# Patient Record
Sex: Female | Born: 1985 | Race: White | Hispanic: No | Marital: Single | Smoking: Former smoker
Health system: Southern US, Community
[De-identification: ages and names within clinical notes are randomized; demographics above are authoritative.]

## PROBLEM LIST (undated history)

## (undated) DIAGNOSIS — F419 Anxiety disorder, unspecified: Secondary | ICD-10-CM

## (undated) DIAGNOSIS — F32A Depression, unspecified: Secondary | ICD-10-CM

## (undated) DIAGNOSIS — F329 Major depressive disorder, single episode, unspecified: Secondary | ICD-10-CM

## (undated) HISTORY — DX: Anxiety disorder, unspecified: F41.9

## (undated) HISTORY — DX: Major depressive disorder, single episode, unspecified: F32.9

## (undated) HISTORY — DX: Depression, unspecified: F32.A

---

## 2009-12-05 ENCOUNTER — Encounter: Payer: Self-pay | Admitting: Internal Medicine

## 2010-04-13 ENCOUNTER — Encounter: Payer: Self-pay | Admitting: Internal Medicine

## 2010-04-13 ENCOUNTER — Ambulatory Visit (INDEPENDENT_AMBULATORY_CARE_PROVIDER_SITE_OTHER): Payer: BC Managed Care – PPO | Admitting: Internal Medicine

## 2010-04-13 DIAGNOSIS — F411 Generalized anxiety disorder: Secondary | ICD-10-CM

## 2010-04-13 DIAGNOSIS — F329 Major depressive disorder, single episode, unspecified: Secondary | ICD-10-CM

## 2010-04-15 DIAGNOSIS — F329 Major depressive disorder, single episode, unspecified: Secondary | ICD-10-CM | POA: Insufficient documentation

## 2010-04-15 DIAGNOSIS — F411 Generalized anxiety disorder: Secondary | ICD-10-CM | POA: Insufficient documentation

## 2010-04-24 NOTE — Assessment & Plan Note (Signed)
Summary: NEW/BCBS/CD   Vital Signs:  Patient profile:   25 year old female Menstrual status:  regular LMP:     04/05/2010 Height:      63 inches Weight:      170 pounds BMI:     30.22 O2 Sat:      97 % on Room air Temp:     98.7 degrees F oral Pulse rate:   72 / minute Pulse rhythm:   regular Resp:     16 per minute BP sitting:   118 / 64  (left arm) Cuff size:   large  Vitals Entered By: Rock Nephew CMA (April 13, 2010 4:12 PM)  Nutrition Counseling: Patient's BMI is greater than 25 and therefore counseled on weight management options.  O2 Flow:  Room air CC: anxiety follow-up LMP (date): 04/05/2010     Menstrual Status regular Enter LMP: 04/05/2010 Last PAP Result normal   Primary Care Provider:  Etta Grandchild MD  CC:  anxiety follow-up.  History of Present Illness: New to me she has a history of anxiety, panic, and depression for several years treated by another doctor. She comes in today saying that her anxiety and panic have been a little worse lately as her cat has been ill and she has an new job. She has an occasional feeling of tingling  and muscle twitches when she in the midst of a panic attack but the the symptoms reslove after a dose of Xanax.  Preventive Screening-Counseling & Management  Alcohol-Tobacco     Alcohol drinks/day: <1     Alcohol type: spirits     >5/day in last 3 mos: no     Alcohol Counseling: not indicated; use of alcohol is not excessive or problematic     Feels need to cut down: no     Feels annoyed by complaints: no     Feels guilty re: drinking: no     Needs 'eye opener' in am: no     Smoking Status: never     Tobacco Counseling: not indicated; no tobacco use  Caffeine-Diet-Exercise     Does Patient Exercise: yes  Hep-HIV-STD-Contraception     Hepatitis Risk: no risk noted     HIV Risk: no risk noted     STD Risk: no risk noted      Sexual History:  not active.        Drug Use:  no.        Blood Transfusions:  no.     Current Medications (verified): 1)  Celexa 10 Mg Tabs (Citalopram Hydrobromide) .... Take 1 Tablet By Mouth Once A Day 2)  Alprazolam 0.5 Mg Tabs (Alprazolam) 3)  Nuvaring 0.12-0.015 Mg/24hr Ring (Etonogestrel-Ethinyl Estradiol)  Allergies (verified): No Known Drug Allergies  Past History:  Past Medical History: Anxiety Depression  Past Surgical History: Denies surgical history  Family History: Family History of Anxiety Family History of Arthritis Family History Depression Family History Hypertension  Social History: Occupation: IT consultant Single Never Smoked Alcohol use-yes Drug use-no Regular exercise-yes Smoking Status:  never Hepatitis Risk:  no risk noted HIV Risk:  no risk noted STD Risk:  no risk noted Sexual History:  not active Blood Transfusions:  no Drug Use:  no Does Patient Exercise:  yes  Review of Systems       The patient complains of depression.  The patient denies anorexia, fever, weight loss, weight gain, chest pain, syncope, dyspnea on exertion, peripheral edema, prolonged cough, headaches, hemoptysis, abdominal  pain, hematuria, genital sores, muscle weakness, suspicious skin lesions, unusual weight change, abnormal bleeding, enlarged lymph nodes, and angioedema.   Psych:  Complains of anxiety, depression, easily tearful, irritability, mental problems, and panic attacks; denies alternate hallucination ( auditory/visual), easily angered, sense of great danger, suicidal thoughts/plans, thoughts of violence, unusual visions or sounds, and thoughts /plans of harming others.  Physical Exam  General:  alert, well-developed, well-nourished, well-hydrated, appropriate dress, normal appearance, healthy-appearing, cooperative to examination, good hygiene, and overweight-appearing.   Head:  normocephalic, atraumatic, no abnormalities observed, and no abnormalities palpated.   Eyes:  vision grossly intact, pupils equal, pupils round, and pupils reactive to  light.   Mouth:  Oral mucosa and oropharynx without lesions or exudates.  Teeth in good repair. Neck:  No deformities, masses, or tenderness noted. Lungs:  Normal respiratory effort, chest expands symmetrically. Lungs are clear to auscultation, no crackles or wheezes. Heart:  Normal rate and regular rhythm. S1 and S2 normal without gallop, murmur, click, rub or other extra sounds. Abdomen:  Bowel sounds positive,abdomen soft and non-tender without masses, organomegaly or hernias noted. Msk:  No deformity or scoliosis noted of thoracic or lumbar spine.   Pulses:  R and L carotid,radial,femoral,dorsalis pedis and posterior tibial pulses are full and equal bilaterally Extremities:  No clubbing, cyanosis, edema, or deformity noted with normal full range of motion of all joints.   Neurologic:  alert & oriented X3, cranial nerves II-XII intact, strength normal in all extremities, sensation intact to light touch, sensation intact to pinprick, gait normal, DTRs symmetrical and normal, finger-to-nose normal, heel-to-shin normal, toes down bilaterally on Babinski, and Romberg negative.   Skin:  Intact without suspicious lesions or rashes Cervical Nodes:  No lymphadenopathy noted Axillary Nodes:  No palpable lymphadenopathy Psych:  Oriented X3, memory intact for recent and remote, normally interactive, good eye contact, not agitated, not suicidal, not homicidal, dysphoric affect, and moderately anxious.     Impression & Recommendations:  Problem # 1:  DEPRESSION (ICD-311) Assessment Deteriorated will increase the dose of citalopram and refer her to Vernell Leep for psychotherapy Her updated medication list for this problem includes:    Alprazolam 0.5 Mg Tabs (Alprazolam) ..... One by mouth three times a day as needed for anxiety    Citalopram Hydrobromide 20 Mg Tabs (Citalopram hydrobromide) ..... One by mouth once daily  Problem # 2:  ANXIETY (ICD-300.00) Assessment: Deteriorated  Her updated  medication list for this problem includes:    Alprazolam 0.5 Mg Tabs (Alprazolam) ..... One by mouth three times a day as needed for anxiety    Citalopram Hydrobromide 20 Mg Tabs (Citalopram hydrobromide) ..... One by mouth once daily  Complete Medication List: 1)  Alprazolam 0.5 Mg Tabs (Alprazolam) .... One by mouth three times a day as needed for anxiety 2)  Nuvaring 0.12-0.015 Mg/24hr Ring (Etonogestrel-ethinyl estradiol) 3)  Citalopram Hydrobromide 20 Mg Tabs (Citalopram hydrobromide) .... One by mouth once daily  Patient Instructions: 1)  Please schedule a follow-up appointment in 2 months. 2)  Start therapy with Vernell Leep [phone]. Prescriptions: ALPRAZOLAM 0.5 MG TABS (ALPRAZOLAM) One by mouth three times a day as needed for anxiety  #90 x 5   Entered and Authorized by:   Etta Grandchild MD   Signed by:   Etta Grandchild MD on 04/13/2010   Method used:   Print then Give to Patient   RxID:   [id number] CITALOPRAM HYDROBROMIDE 20 MG TABS (CITALOPRAM HYDROBROMIDE) One by mouth once daily  #  30 x 11   Entered and Authorized by:   Etta Grandchild MD   Signed by:   Etta Grandchild MD on 04/13/2010   Method used:   Print then Give to Patient   RxID:   [id number]    Orders Added: 1)  New Patient Level IV [34742]    Preventive Care Screening  Pap Smear:    Date:  12/05/2009    Results:  normal   Last Tetanus Booster:    Date:  02/05/2003    Results:  Historical

## 2010-10-15 ENCOUNTER — Ambulatory Visit: Payer: BC Managed Care – PPO | Admitting: Internal Medicine

## 2010-10-17 ENCOUNTER — Ambulatory Visit (INDEPENDENT_AMBULATORY_CARE_PROVIDER_SITE_OTHER): Payer: BC Managed Care – PPO | Admitting: Internal Medicine

## 2010-10-17 ENCOUNTER — Encounter: Payer: Self-pay | Admitting: Internal Medicine

## 2010-10-17 VITALS — BP 112/72 | HR 88 | Temp 98.4°F | Resp 16 | Wt 161.0 lb

## 2010-10-17 DIAGNOSIS — F329 Major depressive disorder, single episode, unspecified: Secondary | ICD-10-CM

## 2010-10-17 DIAGNOSIS — F411 Generalized anxiety disorder: Secondary | ICD-10-CM

## 2010-10-17 MED ORDER — ALPRAZOLAM 0.5 MG PO TABS: 0.5000 mg | ORAL_TABLET | Freq: Three times a day (TID) | ORAL | Status: DC | PRN

## 2010-10-17 NOTE — Assessment & Plan Note (Signed)
Continue xanax prn.   

## 2010-10-17 NOTE — Assessment & Plan Note (Signed)
Continue celexa

## 2010-10-17 NOTE — Progress Notes (Signed)
  Subjective:    Patient ID: Rebecca Lawson, female    DOB: 1985-08-23, 25 y.o.   MRN: [id number]  HPI She returns for f/up and she tells me that she still occasionally has anxiety and panic but overall is doing much better, she requests a refill.    Review of Systems  Constitutional: Negative.   HENT: Negative.   Eyes: Negative.   Respiratory: Negative.   Cardiovascular: Negative.   Gastrointestinal: Negative.   Genitourinary: Negative.   Musculoskeletal: Negative.   Skin: Negative.   Neurological: Negative.   Hematological: Negative.   Psychiatric/Behavioral: Positive for sleep disturbance. Negative for suicidal ideas, hallucinations, behavioral problems, confusion, self-injury, dysphoric mood, decreased concentration and agitation. The patient is nervous/anxious. The patient is not hyperactive.        Objective:   Physical Exam  Vitals reviewed. Constitutional: She is oriented to person, place, and time. She appears well-developed and well-nourished. No distress.  HENT:  Mouth/Throat: Oropharynx is clear and moist. No oropharyngeal exudate.  Eyes: Conjunctivae are normal. Right eye exhibits no discharge. Left eye exhibits no discharge. No scleral icterus.  Neck: Normal range of motion. Neck supple. No JVD present. No tracheal deviation present. No thyromegaly present.  Cardiovascular: Normal rate, regular rhythm, normal heart sounds and intact distal pulses.  Exam reveals no gallop and no friction rub.   No murmur heard. Pulmonary/Chest: Effort normal and breath sounds normal. No stridor. No respiratory distress. She has no wheezes. She has no rales. She exhibits no tenderness.  Abdominal: Soft. Bowel sounds are normal. She exhibits no distension and no mass. There is no tenderness. There is no rebound and no guarding.  Musculoskeletal: Normal range of motion. She exhibits no edema and no tenderness.  Lymphadenopathy:    She has no cervical adenopathy.  Neurological: She is  oriented to person, place, and time. She displays normal reflexes. She exhibits normal muscle tone. Coordination normal.  Skin: Skin is warm and dry. No rash noted. She is not diaphoretic. No erythema. No pallor.  Psychiatric: She has a normal mood and affect. Her behavior is normal. Judgment and thought content normal.          Assessment & Plan:

## 2010-10-17 NOTE — Patient Instructions (Signed)
Anxiety and Panic Attacks Your caregiver has informed you that you are having an anxiety or panic attack. There may be many forms of this. Most of the time these attacks come suddenly and without warning. They come at any time of day, including periods of sleep, and at any time of life. They may be strong and unexplained. Although panic attacks are very scary, they are physically harmless. Sometimes the cause of your anxiety is not known. Anxiety is a protective mechanism of the body in its fight or flight mechanism. Most of these perceived danger situations are actually nonphysical situations (such as anxiety over losing a job). CAUSES The causes of an anxiety or panic attack are many. Panic attacks may occur in otherwise healthy people given a certain set of circumstances. There may be a genetic cause for panic attacks. Some medications may also have anxiety as a side effect. SYMPTOMS Some of the most common feelings are:  Intense terror.  Dizziness, feeling faint.   Hot and cold flashes.   Fear of going crazy.   Feelings that nothing is real.   Sweating.   Shaking.   Chest pain or a fast heartbeat (palpitations).  Smothering, choking sensations.   Feelings of impending doom and that death is near.   Tingling of extremities, this may be from over breathing.   Altered reality (derealization).   Being detached from yourself (depersonalization).   Several symptoms can be present to make up anxiety or panic attacks. DIAGNOSIS The evaluation by your caregiver will depend on the type of symptoms you are experiencing. The diagnosis of anxiety or pain attack is made when no physical illness can be determined to be a cause of the symptoms. TREATMENT Treatment to prevent anxiety and panic attacks may include:  Avoidance of circumstances that cause anxiety.   Reassurance and relaxation.   Regular exercise.   Relaxation therapies, such as yoga.   Psychotherapy with a psychiatrist  or therapist.   Avoidance of caffeine, alcohol and illegal drugs.   Prescribed medication.  SEEK IMMEDIATE MEDICAL CARE IF:  You experience panic attack symptoms that are different than your usual symptoms.   You have any worsening or concerning symptoms.  Document Released: 01/21/2005 Document Re-Released: 07/11/2009 [employer] Patient Information 2011 [employer]. 

## 2011-01-10 ENCOUNTER — Telehealth: Payer: Self-pay

## 2011-01-10 DIAGNOSIS — F329 Major depressive disorder, single episode, unspecified: Secondary | ICD-10-CM

## 2011-01-10 DIAGNOSIS — F411 Generalized anxiety disorder: Secondary | ICD-10-CM

## 2011-01-10 DIAGNOSIS — F3289 Other specified depressive episodes: Secondary | ICD-10-CM

## 2011-01-10 MED ORDER — CITALOPRAM HYDROBROMIDE 40 MG PO TABS: 40.0000 mg | ORAL_TABLET | Freq: Every day | ORAL | Status: DC

## 2011-01-10 NOTE — Telephone Encounter (Signed)
Patient called c/o increased depression due to change of seasons. Patient would like to know if MD will increase dosage of celexa, Current dosage is 20mg  qd.

## 2011-01-10 NOTE — Telephone Encounter (Signed)
Pharmacy  needed.

## 2011-01-10 NOTE — Telephone Encounter (Signed)
Patient called back and gave pharmacy information and new rx called in.

## 2011-01-10 NOTE — Telephone Encounter (Signed)
LMOVM for pt to call back with pharmacy info

## 2011-02-22 ENCOUNTER — Ambulatory Visit: Payer: BC Managed Care – PPO | Admitting: Internal Medicine

## 2011-04-01 ENCOUNTER — Encounter: Payer: Self-pay | Admitting: Internal Medicine

## 2011-04-01 ENCOUNTER — Ambulatory Visit (INDEPENDENT_AMBULATORY_CARE_PROVIDER_SITE_OTHER): Payer: BC Managed Care – PPO | Admitting: Internal Medicine

## 2011-04-01 DIAGNOSIS — F411 Generalized anxiety disorder: Secondary | ICD-10-CM

## 2011-04-01 DIAGNOSIS — F329 Major depressive disorder, single episode, unspecified: Secondary | ICD-10-CM

## 2011-04-01 MED ORDER — CITALOPRAM HYDROBROMIDE 40 MG PO TABS: 40.0000 mg | ORAL_TABLET | Freq: Every day | ORAL | Status: DC

## 2011-04-01 MED ORDER — ALPRAZOLAM 0.5 MG PO TABS: 0.5000 mg | ORAL_TABLET | Freq: Three times a day (TID) | ORAL | Status: DC | PRN

## 2011-04-01 MED ORDER — ETONOGESTREL-ETHINYL ESTRADIOL 0.12-0.015 MG/24HR VA RING: VAGINAL_RING | VAGINAL | Status: DC

## 2011-04-01 NOTE — Assessment & Plan Note (Signed)
Continue celexa at the current dose

## 2011-04-01 NOTE — Progress Notes (Signed)
  Subjective:    Patient ID: Rebecca Lawson, female    DOB: Sep 30, 1985, 26 y.o.   MRN: [id number]  HPI She returns for f/up and tells me that she is doing well and needs a refill on meds.  Review of Systems  Constitutional: Negative.   HENT: Negative.   Eyes: Negative.   Respiratory: Negative.   Cardiovascular: Negative.   Gastrointestinal: Negative.   Genitourinary: Negative.   Musculoskeletal: Negative.   Skin: Negative.   Neurological: Negative.   Hematological: Negative.   Psychiatric/Behavioral: Negative for suicidal ideas, hallucinations, behavioral problems, confusion, sleep disturbance, self-injury, dysphoric mood, decreased concentration and agitation. The patient is nervous/anxious. The patient is not hyperactive.        Objective:   Physical Exam  Vitals reviewed. Constitutional: She is oriented to person, place, and time. She appears well-developed and well-nourished. No distress.  HENT:  Head: Normocephalic and atraumatic.  Mouth/Throat: Oropharynx is clear and moist. No oropharyngeal exudate.  Eyes: Conjunctivae are normal. Right eye exhibits no discharge. Left eye exhibits no discharge. No scleral icterus.  Neck: Normal range of motion. Neck supple. No JVD present. No tracheal deviation present. No thyromegaly present.  Cardiovascular: Normal rate, regular rhythm, normal heart sounds and intact distal pulses.  Exam reveals no gallop and no friction rub.   No murmur heard. Pulmonary/Chest: Effort normal. No stridor. No respiratory distress. She has no wheezes. She has no rales. She exhibits no tenderness.  Abdominal: Soft. Bowel sounds are normal. She exhibits no distension and no mass. There is no tenderness. There is no rebound and no guarding.  Musculoskeletal: Normal range of motion. She exhibits no edema and no tenderness.  Lymphadenopathy:    She has no cervical adenopathy.  Neurological: She is oriented to person, place, and time.  Skin: Skin is warm and dry.  No rash noted. She is not diaphoretic. No erythema. No pallor.  Psychiatric: She has a normal mood and affect. Her behavior is normal. Judgment and thought content normal.          Assessment & Plan:

## 2011-04-01 NOTE — Assessment & Plan Note (Signed)
Continue xanax as needed, she was referred to [facility]ell Leep for psychotherapy

## 2011-04-01 NOTE — Patient Instructions (Signed)

## 2011-05-02 ENCOUNTER — Other Ambulatory Visit: Payer: Self-pay | Admitting: Internal Medicine

## 2012-02-13 LAB — HM PAP SMEAR: HM Pap smear: ABNORMAL

## 2012-02-17 ENCOUNTER — Other Ambulatory Visit (INDEPENDENT_AMBULATORY_CARE_PROVIDER_SITE_OTHER): Payer: BC Managed Care – PPO

## 2012-02-17 ENCOUNTER — Encounter: Payer: Self-pay | Admitting: Internal Medicine

## 2012-02-17 ENCOUNTER — Ambulatory Visit (INDEPENDENT_AMBULATORY_CARE_PROVIDER_SITE_OTHER): Payer: BC Managed Care – PPO | Admitting: Internal Medicine

## 2012-02-17 VITALS — BP 116/82 | HR 97 | Temp 98.8°F | Resp 16 | Ht 63.0 in | Wt 202.8 lb

## 2012-02-17 DIAGNOSIS — Z0000 Encounter for general adult medical examination without abnormal findings: Secondary | ICD-10-CM

## 2012-02-17 DIAGNOSIS — F3289 Other specified depressive episodes: Secondary | ICD-10-CM

## 2012-02-17 DIAGNOSIS — F329 Major depressive disorder, single episode, unspecified: Secondary | ICD-10-CM

## 2012-02-17 LAB — URINALYSIS, ROUTINE W REFLEX MICROSCOPIC
Bilirubin Urine: NEGATIVE
Ketones, ur: NEGATIVE
Leukocytes, UA: NEGATIVE
Specific Gravity, Urine: <=1.005 (ref 1.000–1.030)
Urine Glucose: NEGATIVE
Urobilinogen, UA: 0.2 (ref 0.0–1.0)

## 2012-02-17 LAB — LIPID PANEL
Cholesterol: 220 mg/dL — ABNORMAL HIGH (ref 0–200)
Total CHOL/HDL Ratio: 5
Triglycerides: 176 mg/dL — ABNORMAL HIGH (ref 0.0–149.0)
VLDL: 35.2 mg/dL (ref 0.0–40.0)

## 2012-02-17 LAB — COMPREHENSIVE METABOLIC PANEL
BUN: 10 mg/dL (ref 6–23)
CO2: 29 mEq/L (ref 19–32)
Calcium: 9.5 mg/dL (ref 8.4–10.5)
Chloride: 101 mEq/L (ref 96–112)
Creatinine, Ser: 0.7 mg/dL (ref 0.4–1.2)
GFR: 107.22 mL/min (ref >60.00)

## 2012-02-17 LAB — CBC WITH DIFFERENTIAL/PLATELET
Basophils Absolute: 0 10*3/uL (ref 0.0–0.1)
Basophils Relative: 0.2 % (ref 0.0–3.0)
Hemoglobin: 13.8 g/dL (ref 12.0–15.0)
Lymphocytes Relative: 18.1 % (ref 12.0–46.0)
Monocytes Relative: 5.4 % (ref 3.0–12.0)
Neutro Abs: 8.3 10*3/uL — ABNORMAL HIGH (ref 1.4–7.7)
Neutrophils Relative %: 75.5 % (ref 43.0–77.0)
RBC: 4.71 Mil/uL (ref 3.87–5.11)
RDW: 12.4 % (ref 11.5–14.6)

## 2012-02-17 LAB — TSH: TSH: 0.89 u[IU]/mL (ref 0.35–5.50)

## 2012-02-17 NOTE — Progress Notes (Signed)
  Subjective:    Patient ID: Rebecca Lawson, female    DOB: 05-Jul-1985, 27 y.o.   MRN: [id number]  HPI  She returns for a physical - she feels well and offers no complaints.  Review of Systems  Constitutional: Negative.   HENT: Negative.   Eyes: Negative.   Respiratory: Negative for chest tightness, shortness of breath, wheezing and stridor.   Cardiovascular: Negative for chest pain, palpitations and leg swelling.  Gastrointestinal: Negative for nausea, vomiting, abdominal pain, diarrhea, constipation and blood in stool.  Genitourinary: Negative.   Musculoskeletal: Negative.   Skin: Negative.   Neurological: Negative.   Hematological: Negative for adenopathy. Does not bruise/bleed easily.  Psychiatric/Behavioral: Positive for dysphoric mood. Negative for suicidal ideas, hallucinations, behavioral problems, confusion, sleep disturbance, self-injury, decreased concentration and agitation. The patient is nervous/anxious. The patient is not hyperactive.        Objective:   Physical Exam  Vitals reviewed. Constitutional: She is oriented to person, place, and time. She appears well-developed and well-nourished. No distress.  HENT:  Head: Normocephalic and atraumatic.  Mouth/Throat: Oropharynx is clear and moist. No oropharyngeal exudate.  Eyes: Conjunctivae normal are normal. Right eye exhibits no discharge. Left eye exhibits no discharge. No scleral icterus.  Neck: Normal range of motion. Neck supple. No JVD present. No tracheal deviation present. No thyromegaly present.  Cardiovascular: Normal rate, regular rhythm and intact distal pulses.  Exam reveals no gallop and no friction rub.   No murmur heard. Pulmonary/Chest: Effort normal and breath sounds normal. No stridor. No respiratory distress. She has no wheezes. She has no rales. She exhibits no tenderness.  Abdominal: Soft. Bowel sounds are normal. She exhibits no distension and no mass. There is no tenderness. There is no rebound and  no guarding.  Musculoskeletal: Normal range of motion. She exhibits no edema and no tenderness.  Lymphadenopathy:    She has no cervical adenopathy.  Neurological: She is oriented to person, place, and time.  Skin: Skin is warm and dry. No rash noted. She is not diaphoretic. No erythema. No pallor.  Psychiatric: She has a normal mood and affect. Her behavior is normal. Judgment and thought content normal.     No results found for this basename: WBC, HGB, HCT, PLT, GLUCOSE, CHOL, TRIG, HDL, LDLDIRECT, LDLCALC, ALT, AST, NA, K, CL, CREATININE, BUN, CO2, TSH, PSA, INR, GLUF, HGBA1C, MICROALBUR       Assessment & Plan:

## 2012-02-17 NOTE — Assessment & Plan Note (Signed)
Exam done Vaccines were reviewed and updated Labs ordered Pt ed material was given 

## 2012-02-17 NOTE — Patient Instructions (Signed)
Preventive Care for Adults, Female A healthy lifestyle and preventive care can promote health and wellness. Preventive health guidelines for women include the following key practices.  A routine yearly physical is a good way to check with your caregiver about your health and preventive screening. It is a chance to share any concerns and updates on your health, and to receive a thorough exam.  Visit your dentist for a routine exam and preventive care every 6 months. Brush your teeth twice a day and floss once a day. Good oral hygiene prevents tooth decay and gum disease.  The frequency of eye exams is based on your age, health, family medical history, use of contact lenses, and other factors. Follow your caregiver's recommendations for frequency of eye exams.  Eat a healthy diet. Foods like vegetables, fruits, whole grains, low-fat dairy products, and lean protein foods contain the nutrients you need without too many calories. Decrease your intake of foods high in solid fats, added sugars, and salt. Eat the right amount of calories for you.Get information about a proper diet from your caregiver, if necessary.  Regular physical exercise is one of the most important things you can do for your health. Most adults should get at least 150 minutes of moderate-intensity exercise (any activity that increases your heart rate and causes you to sweat) each week. In addition, most adults need muscle-strengthening exercises on 2 or more days a week.  Maintain a healthy weight. The body mass index (BMI) is a screening tool to identify possible weight problems. It provides an estimate of body fat based on height and weight. Your caregiver can help determine your BMI, and can help you achieve or maintain a healthy weight.For adults 20 years and older:  A BMI below 18.5 is considered underweight.  A BMI of 18.5 to 24.9 is normal.  A BMI of 25 to 29.9 is considered overweight.  A BMI of 30 and above is  considered obese.  Maintain normal blood lipids and cholesterol levels by exercising and minimizing your intake of saturated fat. Eat a balanced diet with plenty of fruit and vegetables. Blood tests for lipids and cholesterol should begin at age 20 and be repeated every 5 years. If your lipid or cholesterol levels are high, you are over 50, or you are at high risk for heart disease, you may need your cholesterol levels checked more frequently.Ongoing high lipid and cholesterol levels should be treated with medicines if diet and exercise are not effective.  If you smoke, find out from your caregiver how to quit. If you do not use tobacco, do not start.  If you are pregnant, do not drink alcohol. If you are breastfeeding, be very cautious about drinking alcohol. If you are not pregnant and choose to drink alcohol, do not exceed 1 drink per day. One drink is considered to be 12 ounces (355 mL) of beer, 5 ounces (148 mL) of wine, or 1.5 ounces (44 mL) of liquor.  Avoid use of street drugs. Do not share needles with anyone. Ask for help if you need support or instructions about stopping the use of drugs.  High blood pressure causes heart disease and increases the risk of stroke. Your blood pressure should be checked at least every 1 to 2 years. Ongoing high blood pressure should be treated with medicines if weight loss and exercise are not effective.  If you are 55 to 27 years old, ask your caregiver if you should take aspirin to prevent strokes.  Diabetes   screening involves taking a blood sample to check your fasting blood sugar level. This should be done once every 3 years, after age 45, if you are within normal weight and without risk factors for diabetes. Testing should be considered at a younger age or be carried out more frequently if you are overweight and have at least 1 risk factor for diabetes.  Breast cancer screening is essential preventive care for women. You should practice "breast  self-awareness." This means understanding the normal appearance and feel of your breasts and may include breast self-examination. Any changes detected, no matter how small, should be reported to a caregiver. Women in their 20s and 30s should have a clinical breast exam (CBE) by a caregiver as part of a regular health exam every 1 to 3 years. After age 40, women should have a CBE every year. Starting at age 40, women should consider having a mammography (breast X-ray test) every year. Women who have a family history of breast cancer should talk to their caregiver about genetic screening. Women at a high risk of breast cancer should talk to their caregivers about having magnetic resonance imaging (MRI) and a mammography every year.  The Pap test is a screening test for cervical cancer. A Pap test can show cell changes on the cervix that might become cervical cancer if left untreated. A Pap test is a procedure in which cells are obtained and examined from the lower end of the uterus (cervix).  Women should have a Pap test starting at age 21.  Between ages 21 and 29, Pap tests should be repeated every 2 years.  Beginning at age 30, you should have a Pap test every 3 years as long as the past 3 Pap tests have been normal.  Some women have medical problems that increase the chance of getting cervical cancer. Talk to your caregiver about these problems. It is especially important to talk to your caregiver if a new problem develops soon after your last Pap test. In these cases, your caregiver may recommend more frequent screening and Pap tests.  The above recommendations are the same for women who have or have not gotten the vaccine for human papillomavirus (HPV).  If you had a hysterectomy for a problem that was not cancer or a condition that could lead to cancer, then you no longer need Pap tests. Even if you no longer need a Pap test, a regular exam is a good idea to make sure no other problems are  starting.  If you are between ages 65 and 70, and you have had normal Pap tests going back 10 years, you no longer need Pap tests. Even if you no longer need a Pap test, a regular exam is a good idea to make sure no other problems are starting.  If you have had past treatment for cervical cancer or a condition that could lead to cancer, you need Pap tests and screening for cancer for at least 20 years after your treatment.  If Pap tests have been discontinued, risk factors (such as a new sexual partner) need to be reassessed to determine if screening should be resumed.  The HPV test is an additional test that may be used for cervical cancer screening. The HPV test looks for the virus that can cause the cell changes on the cervix. The cells collected during the Pap test can be tested for HPV. The HPV test could be used to screen women aged 30 years and older, and should   be used in women of any age who have unclear Pap test results. After the age of 30, women should have HPV testing at the same frequency as a Pap test.  Colorectal cancer can be detected and often prevented. Most routine colorectal cancer screening begins at the age of 50 and continues through age 75. However, your caregiver may recommend screening at an earlier age if you have risk factors for colon cancer. On a yearly basis, your caregiver may provide home test kits to check for hidden blood in the stool. Use of a small camera at the end of a tube, to directly examine the colon (sigmoidoscopy or colonoscopy), can detect the earliest forms of colorectal cancer. Talk to your caregiver about this at age 50, when routine screening begins. Direct examination of the colon should be repeated every 5 to 10 years through age 75, unless early forms of pre-cancerous polyps or small growths are found.  Hepatitis C blood testing is recommended for all people born from 1945 through 1965 and any individual with known risks for hepatitis C.  Practice  safe sex. Use condoms and avoid high-risk sexual practices to reduce the spread of sexually transmitted infections (STIs). STIs include gonorrhea, chlamydia, syphilis, trichomonas, herpes, HPV, and human immunodeficiency virus (HIV). Herpes, HIV, and HPV are viral illnesses that have no cure. They can result in disability, cancer, and death. Sexually active women aged 25 and younger should be checked for chlamydia. Older women with new or multiple partners should also be tested for chlamydia. Testing for other STIs is recommended if you are sexually active and at increased risk.  Osteoporosis is a disease in which the bones lose minerals and strength with aging. This can result in serious bone fractures. The risk of osteoporosis can be identified using a bone density scan. Women ages 65 and over and women at risk for fractures or osteoporosis should discuss screening with their caregivers. Ask your caregiver whether you should take a calcium supplement or vitamin D to reduce the rate of osteoporosis.  Menopause can be associated with physical symptoms and risks. Hormone replacement therapy is available to decrease symptoms and risks. You should talk to your caregiver about whether hormone replacement therapy is right for you.  Use sunscreen with sun protection factor (SPF) of 30 or more. Apply sunscreen liberally and repeatedly throughout the day. You should seek shade when your shadow is shorter than you. Protect yourself by wearing long sleeves, pants, a wide-brimmed hat, and sunglasses year round, whenever you are outdoors.  Once a month, do a whole body skin exam, using a mirror to look at the skin on your back. Notify your caregiver of new moles, moles that have irregular borders, moles that are larger than a pencil eraser, or moles that have changed in shape or color.  Stay current with required immunizations.  Influenza. You need a dose every fall (or winter). The composition of the flu vaccine  changes each year, so being vaccinated once is not enough.  Pneumococcal polysaccharide. You need 1 to 2 doses if you smoke cigarettes or if you have certain chronic medical conditions. You need 1 dose at age 65 (or older) if you have never been vaccinated.  Tetanus, diphtheria, pertussis (Tdap, Td). Get 1 dose of Tdap vaccine if you are younger than age 65, are over 65 and have contact with an infant, are a healthcare worker, are pregnant, or simply want to be protected from whooping cough. After that, you need a Td   booster dose every 10 years. Consult your caregiver if you have not had at least 3 tetanus and diphtheria-containing shots sometime in your life or have a deep or dirty wound.  HPV. You need this vaccine if you are a woman age 26 or younger. The vaccine is given in 3 doses over 6 months.  Measles, mumps, rubella (MMR). You need at least 1 dose of MMR if you were born in 1957 or later. You may also need a second dose.  Meningococcal. If you are age 19 to 21 and a first-year college student living in a residence hall, or have one of several medical conditions, you need to get vaccinated against meningococcal disease. You may also need additional booster doses.  Zoster (shingles). If you are age 60 or older, you should get this vaccine.  Varicella (chickenpox). If you have never had chickenpox or you were vaccinated but received only 1 dose, talk to your caregiver to find out if you need this vaccine.  Hepatitis A. You need this vaccine if you have a specific risk factor for hepatitis A virus infection or you simply wish to be protected from this disease. The vaccine is usually given as 2 doses, 6 to 18 months apart.  Hepatitis B. You need this vaccine if you have a specific risk factor for hepatitis B virus infection or you simply wish to be protected from this disease. The vaccine is given in 3 doses, usually over 6 months. Preventive Services / Frequency Ages 19 to 39  Blood  pressure check.** / Every 1 to 2 years.  Lipid and cholesterol check.** / Every 5 years beginning at age 20.  Clinical breast exam.** / Every 3 years for women in their 20s and 30s.  Pap test.** / Every 2 years from ages 21 through 29. Every 3 years starting at age 30 through age 65 or 70 with a history of 3 consecutive normal Pap tests.  HPV screening.** / Every 3 years from ages 30 through ages 65 to 70 with a history of 3 consecutive normal Pap tests.  Hepatitis C blood test.** / For any individual with known risks for hepatitis C.  Skin self-exam. / Monthly.  Influenza immunization.** / Every year.  Pneumococcal polysaccharide immunization.** / 1 to 2 doses if you smoke cigarettes or if you have certain chronic medical conditions.  Tetanus, diphtheria, pertussis (Tdap, Td) immunization. / A one-time dose of Tdap vaccine. After that, you need a Td booster dose every 10 years.  HPV immunization. / 3 doses over 6 months, if you are 26 and younger.  Measles, mumps, rubella (MMR) immunization. / You need at least 1 dose of MMR if you were born in 1957 or later. You may also need a second dose.  Meningococcal immunization. / 1 dose if you are age 19 to 21 and a first-year college student living in a residence hall, or have one of several medical conditions, you need to get vaccinated against meningococcal disease. You may also need additional booster doses.  Varicella immunization.** / Consult your caregiver.  Hepatitis A immunization.** / Consult your caregiver. 2 doses, 6 to 18 months apart.  Hepatitis B immunization.** / Consult your caregiver. 3 doses usually over 6 months. Ages 40 to 64  Blood pressure check.** / Every 1 to 2 years.  Lipid and cholesterol check.** / Every 5 years beginning at age 20.  Clinical breast exam.** / Every year after age 40.  Mammogram.** / Every year beginning at age 40   and continuing for as long as you are in good health. Consult with your  caregiver.  Pap test.** / Every 3 years starting at age 30 through age 65 or 70 with a history of 3 consecutive normal Pap tests.  HPV screening.** / Every 3 years from ages 30 through ages 65 to 70 with a history of 3 consecutive normal Pap tests.  Fecal occult blood test (FOBT) of stool. / Every year beginning at age 50 and continuing until age 75. You may not need to do this test if you get a colonoscopy every 10 years.  Flexible sigmoidoscopy or colonoscopy.** / Every 5 years for a flexible sigmoidoscopy or every 10 years for a colonoscopy beginning at age 50 and continuing until age 75.  Hepatitis C blood test.** / For all people born from 1945 through 1965 and any individual with known risks for hepatitis C.  Skin self-exam. / Monthly.  Influenza immunization.** / Every year.  Pneumococcal polysaccharide immunization.** / 1 to 2 doses if you smoke cigarettes or if you have certain chronic medical conditions.  Tetanus, diphtheria, pertussis (Tdap, Td) immunization.** / A one-time dose of Tdap vaccine. After that, you need a Td booster dose every 10 years.  Measles, mumps, rubella (MMR) immunization. / You need at least 1 dose of MMR if you were born in 1957 or later. You may also need a second dose.  Varicella immunization.** / Consult your caregiver.  Meningococcal immunization.** / Consult your caregiver.  Hepatitis A immunization.** / Consult your caregiver. 2 doses, 6 to 18 months apart.  Hepatitis B immunization.** / Consult your caregiver. 3 doses, usually over 6 months. Ages 65 and over  Blood pressure check.** / Every 1 to 2 years.  Lipid and cholesterol check.** / Every 5 years beginning at age 20.  Clinical breast exam.** / Every year after age 40.  Mammogram.** / Every year beginning at age 40 and continuing for as long as you are in good health. Consult with your caregiver.  Pap test.** / Every 3 years starting at age 30 through age 65 or 70 with a 3  consecutive normal Pap tests. Testing can be stopped between 65 and 70 with 3 consecutive normal Pap tests and no abnormal Pap or HPV tests in the past 10 years.  HPV screening.** / Every 3 years from ages 30 through ages 65 or 70 with a history of 3 consecutive normal Pap tests. Testing can be stopped between 65 and 70 with 3 consecutive normal Pap tests and no abnormal Pap or HPV tests in the past 10 years.  Fecal occult blood test (FOBT) of stool. / Every year beginning at age 50 and continuing until age 75. You may not need to do this test if you get a colonoscopy every 10 years.  Flexible sigmoidoscopy or colonoscopy.** / Every 5 years for a flexible sigmoidoscopy or every 10 years for a colonoscopy beginning at age 50 and continuing until age 75.  Hepatitis C blood test.** / For all people born from 1945 through 1965 and any individual with known risks for hepatitis C.  Osteoporosis screening.** / A one-time screening for women ages 65 and over and women at risk for fractures or osteoporosis.  Skin self-exam. / Monthly.  Influenza immunization.** / Every year.  Pneumococcal polysaccharide immunization.** / 1 dose at age 65 (or older) if you have never been vaccinated.  Tetanus, diphtheria, pertussis (Tdap, Td) immunization. / A one-time dose of Tdap vaccine if you are over   65 and have contact with an infant, are a healthcare worker, or simply want to be protected from whooping cough. After that, you need a Td booster dose every 10 years.  Varicella immunization.** / Consult your caregiver.  Meningococcal immunization.** / Consult your caregiver.  Hepatitis A immunization.** / Consult your caregiver. 2 doses, 6 to 18 months apart.  Hepatitis B immunization.** / Check with your caregiver. 3 doses, usually over 6 months. ** Family history and personal history of risk and conditions may change your caregiver's recommendations. Document Released: 03/19/2001 Document Revised: 04/15/2011  Document Reviewed: 06/18/2010 [employer] Patient Information 2013 [employer].  

## 2012-04-25 ENCOUNTER — Other Ambulatory Visit: Payer: Self-pay | Admitting: Internal Medicine

## 2012-07-06 ENCOUNTER — Emergency Department (HOSPITAL_COMMUNITY)
Admission: EM | Admit: 2012-07-06 | Discharge: 2012-07-06 | Disposition: A | Discharge status: Home / Self Care | Payer: BC Managed Care – PPO | Attending: Emergency Medicine | Admitting: Emergency Medicine

## 2012-07-06 ENCOUNTER — Encounter (HOSPITAL_COMMUNITY): Payer: Self-pay | Admitting: Emergency Medicine

## 2012-07-06 DIAGNOSIS — G40909 Epilepsy, unspecified, not intractable, without status epilepticus: Secondary | ICD-10-CM | POA: Insufficient documentation

## 2012-07-06 DIAGNOSIS — T50995A Adverse effect of other drugs, medicaments and biological substances, initial encounter: Secondary | ICD-10-CM | POA: Insufficient documentation

## 2012-07-06 DIAGNOSIS — F3289 Other specified depressive episodes: Secondary | ICD-10-CM | POA: Insufficient documentation

## 2012-07-06 DIAGNOSIS — Z79899 Other long term (current) drug therapy: Secondary | ICD-10-CM | POA: Insufficient documentation

## 2012-07-06 DIAGNOSIS — F329 Major depressive disorder, single episode, unspecified: Secondary | ICD-10-CM | POA: Insufficient documentation

## 2012-07-06 DIAGNOSIS — F411 Generalized anxiety disorder: Secondary | ICD-10-CM | POA: Insufficient documentation

## 2012-07-06 NOTE — ED Provider Notes (Signed)
History     CSN: [id number]  Arrival date & time 07/06/12  1458   First MD Initiated Contact with Patient 07/06/12 1541      Chief Complaint  Patient presents with  . Medication Reaction    (Consider location/radiation/quality/duration/timing/severity/associated sxs/prior treatment) HPI Comments: Rebecca Lawson is a 27 y.o. female who was worried that she will have a bad trip after taking "Blotter  Acid" at 11:45 AM. She was with a friend, who took the same medication at the same time, and then had a seizure. She did not ingest anything else. She ate a normal breakfast earlier this morning. She is clear, coherent, and able to give good history. She denies headache, weakness, dizziness, nausea, vomiting, shortness of breath, or chest pain. There are no other modifying factors.  The history is provided by the patient.    Past Medical History  Diagnosis Date  . Anxiety   . Depression     History reviewed. No pertinent past surgical history.  Family History  Problem Relation Age of Onset  . Arthritis Other   . Depression Other   . Hypertension Other   . Anxiety disorder Other     History  Substance Use Topics  . Smoking status: Never Smoker   . Smokeless tobacco: Not on file     Comment: Regular Exercise - Yes  . Alcohol Use: 1.8 oz/week    3 Glasses of wine per week    OB History   Grav Para Term Preterm Abortions TAB SAB Ect Mult Living                  Review of Systems  All other systems reviewed and are negative.    Allergies  Review of patient's allergies indicates no known allergies.  Home Medications   Current Outpatient Rx  Name  Route  Sig  Dispense  Refill  . buPROPion (WELLBUTRIN XL) 150 MG 24 hr tablet   Oral   Take 150 mg by mouth daily.         Marland Kitchen FLUoxetine (PROZAC) 40 MG capsule   Oral   Take 40 mg by mouth daily.           BP 142/88  Pulse 101  Temp(Src) 98.4 F (36.9 C) (Oral)  Resp 20  SpO2 100%  Physical Exam  Nursing  note and vitals reviewed. Constitutional: She is oriented to person, place, and time. She appears well-developed and well-nourished.  HENT:  Head: Normocephalic and atraumatic.  Eyes: Conjunctivae and EOM are normal. Pupils are equal, round, and reactive to light.  Neck: Normal range of motion and phonation normal. Neck supple.  Cardiovascular: Normal rate, regular rhythm and intact distal pulses.   Pulmonary/Chest: Effort normal and breath sounds normal. She exhibits no tenderness.  Abdominal: Soft. She exhibits no distension. There is no tenderness. There is no guarding.  Musculoskeletal: Normal range of motion.  Neurological: She is alert and oriented to person, place, and time. She has normal strength. No cranial nerve deficit. She exhibits normal muscle tone. Coordination normal.  Good memory  Skin: Skin is warm and dry.  Psychiatric: She has a normal mood and affect. Her behavior is normal. Judgment and thought content normal.    ED Course  Procedures (including critical care time)      1. Drug ingestion, initial encounter       MDM  Ingestion of hallucinogenic medication; without complicating factor. Doubt metabolic instability, serious bacterial infection or impending vascular collapse; the  patient is stable for discharge.  Nursing Notes Reviewed/ Care Coordinated, and agree without changes. Applicable Imaging Reviewed.  Interpretation of Laboratory Data incorporated into ED treatment   Plan: Home Medications- none; Home Treatments- none; Recommended follow up- Return prn          Flint Melter, MD 07/07/12 0028

## 2012-07-06 NOTE — ED Notes (Signed)
Pt states that she took a block amount of acid approx 1 today and states that she had seen her friend have a seizure and she is scared that this may happen to her. No shaking no symtpoms, no dizzyness, alert x4,

## 2013-02-15 ENCOUNTER — Ambulatory Visit (INDEPENDENT_AMBULATORY_CARE_PROVIDER_SITE_OTHER): Payer: BC Managed Care – PPO | Admitting: Internal Medicine

## 2013-02-15 ENCOUNTER — Ambulatory Visit: Payer: BC Managed Care – PPO

## 2013-02-15 VITALS — BP 106/84 | HR 83 | Temp 98.0°F | Resp 16 | Ht 64.0 in | Wt 243.4 lb

## 2013-02-15 DIAGNOSIS — S20211A Contusion of right front wall of thorax, initial encounter: Secondary | ICD-10-CM

## 2013-02-15 DIAGNOSIS — R109 Unspecified abdominal pain: Secondary | ICD-10-CM

## 2013-02-15 DIAGNOSIS — S20219A Contusion of unspecified front wall of thorax, initial encounter: Secondary | ICD-10-CM

## 2013-02-15 DIAGNOSIS — S298XXA Other specified injuries of thorax, initial encounter: Secondary | ICD-10-CM

## 2013-02-15 DIAGNOSIS — R10A1 Flank pain, right side: Secondary | ICD-10-CM

## 2013-02-15 MED ORDER — HYDROCODONE-ACETAMINOPHEN 5-325 MG PO TABS: 1.0000 | ORAL_TABLET | Freq: Four times a day (QID) | ORAL | Status: DC | PRN

## 2013-02-15 NOTE — Progress Notes (Signed)
   Subjective:    Patient ID: Rebecca MemoryStephanie Griego, female    DOB: 10/04/1985, 28 y.o.   MRN: [id number]  HPI    Review of Systems     Objective:   Physical Exam        Assessment & Plan:

## 2013-02-15 NOTE — Patient Instructions (Signed)
Rib Contusion A rib contusion (bruise) can occur by a blow to the chest or by a fall against a hard object. Usually these will be much better in a couple weeks. If X-rays were taken today and there are no broken bones (fractures), the diagnosis of bruising is made. However, broken ribs may not show up for several days, or may be discovered later on a routine X-ray when signs of healing show up. If this happens to you, it does not mean that something was missed on the X-ray, but simply that it did not show up on the first X-rays. Earlier diagnosis will not usually change the treatment. HOME CARE INSTRUCTIONS   Avoid strenuous activity. Be careful during activities and avoid bumping the injured ribs. Activities that pull on the injured ribs and cause pain should be avoided, if possible.  For the first day or two, an ice pack used every 20 minutes while awake may be helpful. Put ice in a plastic bag and put a towel between the bag and the skin.  Eat a normal, well-balanced diet. Drink plenty of fluids to avoid constipation.  Take deep breaths several times a day to keep lungs free of infection. Try to cough several times a day. Splint the injured area with a pillow while coughing to ease pain. Coughing can help prevent pneumonia.  Wear a rib belt or binder only if told to do so by your caregiver. If you are wearing a rib belt or binder, you must do the breathing exercises as directed by your caregiver. If not used properly, rib belts or binders restrict breathing which can lead to pneumonia.  Only take over-the-counter or prescription medicines for pain, discomfort, or fever as directed by your caregiver. SEEK MEDICAL CARE IF:   You or your child has an oral temperature above 102 F (38.9 C).  Your baby is older than 3 months with a rectal temperature of 100.5 F (38.1 C) or higher for more than 1 day.  You develop a cough, with thick or bloody sputum. SEEK IMMEDIATE MEDICAL CARE IF:   You  have difficulty breathing.  You feel sick to your stomach (nausea), have vomiting or belly (abdominal) pain.  You have worsening pain, not controlled with medications, or there is a change in the location of the pain.  You develop sweating or radiation of the pain into the arms, jaw or shoulders, or become light headed or faint.  You or your child has an oral temperature above 102 F (38.9 C), not controlled by medicine.  Your or your baby is older than 3 months with a rectal temperature of 102 F (38.9 C) or higher.  Your baby is 3 months old or younger with a rectal temperature of 100.4 F (38 C) or higher. MAKE SURE YOU:   Understand these instructions.  Will watch your condition.  Will get help right away if you are not doing well or get worse. Document Released: 10/16/2000 Document Revised: 05/18/2012 Document Reviewed: 09/09/2007 [employer] Patient Information 2014 [employer].  

## 2013-02-15 NOTE — Progress Notes (Signed)
   Subjective:    Patient ID: Rebecca Lawson, female    DOB: 09/02/1985, 28 y.o.   MRN: [id number]  HPI  28 y.o. Female presents to clinic with lower right rib pain. States that she fell down her steps at her apartment complex this morning. Denies any trouble breath , but has some pain when deep breaths. Is also having some neck and back pain. Has not taken anything for pain . States that stairs outside of apartment have some kind of mold on them that she slipped on. She was not pushed down and her home is a safe place.  Review of Systems     Objective:   Physical Exam  Vitals reviewed. Constitutional: She appears well-developed and well-nourished. No distress.  HENT:  Head: Normocephalic.  Neck: Normal range of motion. Neck supple.  Cardiovascular: Normal rate, regular rhythm and normal heart sounds.   Pulmonary/Chest: Effort normal and breath sounds normal. She exhibits tenderness.  Musculoskeletal: Normal range of motion. She exhibits tenderness.       Right shoulder: She exhibits tenderness, bony tenderness, pain, spasm and decreased strength. She exhibits normal range of motion, no swelling and no deformity.       Thoracic back: She exhibits tenderness, bony tenderness, pain and spasm. She exhibits normal range of motion, no swelling, no edema, no deformity, no laceration and normal pulse.       Arms: Tender over these ribs   Moves around easily/No distress seen, neck and back have full rom with mild pain.  [facility] reading (PRIMARY) by  Dr.Cristian Davitt no fx seen       Assessment & Plan:  Contusion ribs RICE/Vicodin prn

## 2013-02-18 LAB — HM PAP SMEAR: HM PAP: NORMAL

## 2013-11-06 ENCOUNTER — Telehealth: Payer: Self-pay | Admitting: Family Medicine

## 2013-11-06 ENCOUNTER — Ambulatory Visit (INDEPENDENT_AMBULATORY_CARE_PROVIDER_SITE_OTHER): Payer: BC Managed Care – PPO | Admitting: Family Medicine

## 2013-11-06 ENCOUNTER — Ambulatory Visit (INDEPENDENT_AMBULATORY_CARE_PROVIDER_SITE_OTHER): Payer: BC Managed Care – PPO

## 2013-11-06 VITALS — BP 138/84 | HR 92 | Temp 98.3°F | Resp 18 | Ht 64.0 in

## 2013-11-06 DIAGNOSIS — M25512 Pain in left shoulder: Secondary | ICD-10-CM

## 2013-11-06 DIAGNOSIS — M25511 Pain in right shoulder: Secondary | ICD-10-CM

## 2013-11-06 DIAGNOSIS — M533 Sacrococcygeal disorders, not elsewhere classified: Secondary | ICD-10-CM

## 2013-11-06 MED ORDER — HYDROCODONE-ACETAMINOPHEN 5-325 MG PO TABS: 1.0000 | ORAL_TABLET | Freq: Four times a day (QID) | ORAL | Status: DC | PRN

## 2013-11-06 NOTE — Progress Notes (Signed)
Chief Complaint:  Chief Complaint  Patient presents with  . Tailbone Pain    slipped and fell outside of her apt on Wednesday    HPI: Rebecca Lawson is a 28 y.o. female who is here for pain in her tailbone starting Wednesday, 4 days ago, after she slipped and fell on her bottom in the sacral area due to algae growing on  Apartment stairs from all the rain.  She grabbed the banister and tried to catch herself from falling and  In the process dropped her cell phone but still fell on her bottom. No LOC, no head trauma. She has some pain in her both of her shoulders and upper arms where she braced herself on the railings but no longer has pain and is able to move arms and shoulders. She has tried ibuporfen without relief. She has not iced it. This is the 3rd time she has fallen due to the poor maintenance of the stairs,second time she fell and had rib contusions and came here for that. It  took 8 weeks to heal. She has 6/10 throbbing pain . Denies numbness, tingling or weakness.   She has discomfort with sitting on her bottom and with certain movements. Denies incontinence.   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    Past Medical History  Diagnosis Date  . Anxiety   . Depression    History reviewed. No pertinent past surgical history. History   Social History  . Marital Status: Single    Spouse Name: N/A    Number of Children: N/A  . Years of Education: N/A   Social History Main Topics  . Smoking status: Never Smoker   . Smokeless tobacco: None     Comment: Regular Exercise - Yes  . Alcohol Use: 1.8 oz/week    3 Glasses of wine per week  . Drug Use: No  . Sexual Activity: Not Currently    Birth Control/ Protection: IUD, Condom   Other Topics Concern  . None   Social History Narrative  . None   Family History  Problem Relation Age of Onset  . Arthritis Other   . Depression Other   . Hypertension Other   . Anxiety disorder Other   . Heart disease Father    . Cancer Maternal Grandfather   . Birth defects Paternal Grandfather   . Heart disease Paternal Grandfather    No Known Allergies Prior to Admission medications   Medication Sig Start Date End Date Taking? Authorizing Provider  buPROPion (WELLBUTRIN XL) 150 MG 24 hr tablet Take 150 mg by mouth daily.    Historical Provider, MD  citalopram (CELEXA) 40 MG tablet Take 40 mg by mouth daily.    Historical Provider, MD  FLUoxetine (PROZAC) 40 MG capsule Take 40 mg by mouth daily.    Historical Provider, MD  HYDROcodone-acetaminophen (NORCO/VICODIN) 5-325 MG per tablet Take 1 tablet by mouth every 6 (six) hours as needed. 02/15/13   Jonita Albeehris W Guest, MD     ROS: The patient denies fevers, chills, night sweats, unintentional weight loss, chest pain, palpitations, wheezing, dyspnea on exertion, nausea, vomiting, abdominal pain, dysuria, hematuria, melena, numbness, weakness, or tingling.   All other systems have been reviewed and were otherwise negative with the exception of those mentioned in the HPI and as above.    PHYSICAL EXAM: Filed Vitals:   11/06/13 1503  BP: 138/84  Pulse: 92  Temp: 98.3 F (36.8 C)  Resp:  18   Filed Vitals:   11/06/13 1503  Height: 5\' 4"  (1.626 m)   Body mass index is 0.00 kg/(m^2).  General: Alert, no acute distress, obese white female HEENT:  Normocephalic, atraumatic, oropharynx patent. EOMI, PERRLA Cardiovascular:  Regular rate and rhythm, no rubs murmurs or gallops.  No Carotid bruits, radial pulse intact. No pedal edema.  Respiratory: Clear to auscultation bilaterally.  No wheezes, rales, or rhonchi.  No cyanosis, no use of accessory musculature GI: No organomegaly, abdomen is soft and non-tender, positive bowel sounds.  No masses. Skin: No rashes. Neurologic: Facial musculature symmetric. Psychiatric: Patient is appropriate throughout our interaction. Lymphatic: No cervical lymphadenopathy Musculoskeletal: Gait intact. Neck exam normal-full  ROM Bilateral shoulder exam- no deformities, no ecchymosisneg hawkins/neers, neg empty can, neg liftoff Normal L spine + paramsk tenderness at sacral area  Full ROM 5/5 strength, 2/2 DTRs No saddle anesthesia Straight leg negative Hip and knee exam--normal    LABS:    EKG/XRAY:   Primary read interpreted by [facility] Conley Lawson at [facility]. Difficult to tell due to body habitus but possible displaced coccyx fracture   ASSESSMENT/PLAN: Encounter Diagnoses  Name Primary?  . Sacral back pain Yes  . Shoulder pain, bilateral    [name] who fell down the stairs of her apt due to what she states was algae growing n the stairs and the lack of maintenance of the stairs. She has complained to her landord many time and this is the 3rd time she has fallen because of the lack of maintenance of the stairs even after multiple correspondences with the landlord about fixing them. She is thinking about litigation.  Shoulder exam was WNL so I do not think she  Needs xrays, she will return if xrays are needed Rx norco for pain with precautions Will call patient back with official xray results I am suspecting it may be a fracture or just a contusion of the coccyx   Gross sideeffects, risk and benefits, and alternatives of medications d/w patient. Patient is aware that all medications have potential sideeffects and we are unable to predict every sideeffect or drug-drug interaction that may occur.  LE, THAO PHUONG, DO 11/06/2013 4:19 PM

## 2013-11-06 NOTE — Patient Instructions (Signed)
Tailbone Injury  The tailbone (coccyx) is the small bone at the lower end of the spine. A tailbone injury may involve stretched ligaments, bruising, or a broken bone (fracture). Women are more vulnerable to this injury due to having a wider pelvis.  CAUSES   This type of injury typically occurs from falling and landing on the tailbone. Repeated strain or friction from actions such as rowing and bicycling may also injure the area. The tailbone can be injured during childbirth. Infections or tumors may also press on the tailbone and cause pain. Sometimes, the cause of injury is unknown.  SYMPTOMS    Bruising.   Pain when sitting.   Painful bowel movements.   In women, pain during intercourse.  DIAGNOSIS   Your caregiver can diagnose a tailbone injury based on your symptoms and a physical exam. X-rays may be taken if a fracture is suspected. Your caregiver may also use an MRI scan imaging test to evaluate your symptoms.  TREATMENT   Your caregiver may prescribe medicines to help relieve your pain. Most tailbone injuries heal on their own in 4 to 6 weeks. However, if the injury is caused by an infection or tumor, the recovery period may vary.  PREVENTION   Wear appropriate padding and sports gear when bicycling and rowing. This can help prevent an injury from repeated strain or friction.  HOME CARE INSTRUCTIONS    Put ice on the injured area.   Put ice in a plastic bag.   Place a towel between your skin and the bag.   Leave the ice on for 15-20 minutes, every hour while awake for the first 1 to 2 days.   Sit on a large, rubber or inflated ring or cushion to ease your pain. Lean forward when sitting to help decrease discomfort.   Avoid sitting for long periods of time.   Increase your activity as the pain allows.   Only take over-the-counter or prescription medicines for pain, discomfort, or fever as directed by your caregiver.   You may use stool softeners if it is painful to have a bowel movement, or as  directed by your caregiver.   Eat a diet with plenty of fiber to help prevent constipation.   Keep all follow-up appointments as directed by your caregiver.  SEEK MEDICAL CARE IF:    Your pain becomes worse.   Your bowel movements cause a great deal of discomfort.   You are unable to have a bowel movement.   You have a fever.  MAKE SURE YOU:   Understand these instructions.   Will watch your condition.   Will get help right away if you are not doing well or get worse.  Document Released: 01/19/2000 Document Revised: 04/15/2011 Document Reviewed: 08/16/2010  [employer] Patient Information 2015 [employer]. This information is not intended to replace advice given to you by your health care provider. Make sure you discuss any questions you have with your health care provider.

## 2013-11-09 NOTE — Telephone Encounter (Signed)
error 

## 2014-02-09 ENCOUNTER — Ambulatory Visit (INDEPENDENT_AMBULATORY_CARE_PROVIDER_SITE_OTHER): Payer: BLUE CROSS/BLUE SHIELD | Admitting: *Deleted

## 2014-02-09 ENCOUNTER — Other Ambulatory Visit (INDEPENDENT_AMBULATORY_CARE_PROVIDER_SITE_OTHER): Payer: BLUE CROSS/BLUE SHIELD

## 2014-02-09 ENCOUNTER — Encounter: Payer: Self-pay | Admitting: Internal Medicine

## 2014-02-09 ENCOUNTER — Ambulatory Visit (INDEPENDENT_AMBULATORY_CARE_PROVIDER_SITE_OTHER): Payer: BLUE CROSS/BLUE SHIELD | Admitting: Internal Medicine

## 2014-02-09 VITALS — BP 120/84 | HR 76 | Temp 98.3°F | Resp 16 | Ht 64.0 in | Wt 254.0 lb

## 2014-02-09 DIAGNOSIS — Z23 Encounter for immunization: Secondary | ICD-10-CM

## 2014-02-09 DIAGNOSIS — R002 Palpitations: Secondary | ICD-10-CM

## 2014-02-09 DIAGNOSIS — Z0000 Encounter for general adult medical examination without abnormal findings: Secondary | ICD-10-CM

## 2014-02-09 LAB — LIPID PANEL
CHOLESTEROL: 231 mg/dL — AB (ref 0–200)
HDL: 35 mg/dL — ABNORMAL LOW (ref >39.00)
LDL Cholesterol: 168 mg/dL — ABNORMAL HIGH (ref 0–99)
NonHDL: 196
Total CHOL/HDL Ratio: 7
Triglycerides: 141 mg/dL (ref 0.0–149.0)
VLDL: 28.2 mg/dL (ref 0.0–40.0)

## 2014-02-09 LAB — CBC WITH DIFFERENTIAL/PLATELET
BASOS PCT: 0.4 % (ref 0.0–3.0)
Basophils Absolute: 0 10*3/uL (ref 0.0–0.1)
EOS ABS: 0.1 10*3/uL (ref 0.0–0.7)
Eosinophils Relative: 0.7 % (ref 0.0–5.0)
HEMATOCRIT: 43.9 % (ref 36.0–46.0)
Hemoglobin: 14.6 g/dL (ref 12.0–15.0)
LYMPHS PCT: 20.1 % (ref 12.0–46.0)
Lymphs Abs: 2.5 10*3/uL (ref 0.7–4.0)
MCHC: 33.3 g/dL (ref 30.0–36.0)
MCV: 82.4 fl (ref 78.0–100.0)
Monocytes Absolute: 0.6 10*3/uL (ref 0.1–1.0)
Monocytes Relative: 5 % (ref 3.0–12.0)
Neutro Abs: 9.1 10*3/uL — ABNORMAL HIGH (ref 1.4–7.7)
Neutrophils Relative %: 73.8 % (ref 43.0–77.0)
Platelets: 475 10*3/uL — ABNORMAL HIGH (ref 150.0–400.0)
RBC: 5.34 Mil/uL — AB (ref 3.87–5.11)
RDW: 13.4 % (ref 11.5–15.5)
WBC: 12.4 10*3/uL — ABNORMAL HIGH (ref 4.0–10.5)

## 2014-02-09 LAB — COMPREHENSIVE METABOLIC PANEL
ALK PHOS: 81 U/L (ref 39–117)
ALT: 30 U/L (ref 0–35)
AST: 20 U/L (ref 0–37)
Albumin: 4.4 g/dL (ref 3.5–5.2)
BUN: 13 mg/dL (ref 6–23)
CO2: 25 mEq/L (ref 19–32)
Calcium: 9.2 mg/dL (ref 8.4–10.5)
Chloride: 103 mEq/L (ref 96–112)
Creatinine, Ser: 0.8 mg/dL (ref 0.4–1.2)
GFR: 93.25 mL/min (ref >60.00)
Glucose, Bld: 90 mg/dL (ref 70–99)
POTASSIUM: 3.6 meq/L (ref 3.5–5.1)
Sodium: 136 mEq/L (ref 135–145)
Total Bilirubin: 0.8 mg/dL (ref 0.2–1.2)
Total Protein: 7.6 g/dL (ref 6.0–8.3)

## 2014-02-09 LAB — TSH: TSH: 1.48 u[IU]/mL (ref 0.35–4.50)

## 2014-02-09 LAB — HEMOGLOBIN A1C: Hgb A1c MFr Bld: 5.7 % (ref 4.6–6.5)

## 2014-02-09 MED ORDER — TETANUS-DIPHTH-ACELL PERTUSSIS 5-2.5-18.5 LF-MCG/0.5 IM SUSP
0.5000 mL | Freq: Once | INTRAMUSCULAR | Status: AC
  Administered 2014-02-09: 0.5 mL via INTRAMUSCULAR

## 2014-02-09 NOTE — Patient Instructions (Signed)
Preventive Care for Adults A healthy lifestyle and preventive care can promote health and wellness. Preventive health guidelines for women include the following key practices.  A routine yearly physical is a good way to check with your health care provider about your health and preventive screening. It is a chance to share any concerns and updates on your health and to receive a thorough exam.  Visit your dentist for a routine exam and preventive care every 6 months. Brush your teeth twice a day and floss once a day. Good oral hygiene prevents tooth decay and gum disease.  The frequency of eye exams is based on your age, health, family medical history, use of contact lenses, and other factors. Follow your health care provider's recommendations for frequency of eye exams.  Eat a healthy diet. Foods like vegetables, fruits, whole grains, low-fat dairy products, and lean protein foods contain the nutrients you need without too many calories. Decrease your intake of foods high in solid fats, added sugars, and salt. Eat the right amount of calories for you.Get information about a proper diet from your health care provider, if necessary.  Regular physical exercise is one of the most important things you can do for your health. Most adults should get at least 150 minutes of moderate-intensity exercise (any activity that increases your heart rate and causes you to sweat) each week. In addition, most adults need muscle-strengthening exercises on 2 or more days a week.  Maintain a healthy weight. The body mass index (BMI) is a screening tool to identify possible weight problems. It provides an estimate of body fat based on height and weight. Your health care provider can find your BMI and can help you achieve or maintain a healthy weight.For adults 20 years and older:  A BMI below 18.5 is considered underweight.  A BMI of 18.5 to 24.9 is normal.  A BMI of 25 to 29.9 is considered overweight.  A BMI of  30 and above is considered obese.  Maintain normal blood lipids and cholesterol levels by exercising and minimizing your intake of saturated fat. Eat a balanced diet with plenty of fruit and vegetables. Blood tests for lipids and cholesterol should begin at age 76 and be repeated every 5 years. If your lipid or cholesterol levels are high, you are over 50, or you are at high risk for heart disease, you may need your cholesterol levels checked more frequently.Ongoing high lipid and cholesterol levels should be treated with medicines if diet and exercise are not working.  If you smoke, find out from your health care provider how to quit. If you do not use tobacco, do not start.  Lung cancer screening is recommended for adults aged 22-80 years who are at high risk for developing lung cancer because of a history of smoking. A yearly low-dose CT scan of the lungs is recommended for people who have at least a 30-pack-year history of smoking and are a current smoker or have quit within the past 15 years. A pack year of smoking is smoking an average of 1 pack of cigarettes a day for 1 year (for example: 1 pack a day for 30 years or 2 packs a day for 15 years). Yearly screening should continue until the smoker has stopped smoking for at least 15 years. Yearly screening should be stopped for people who develop a health problem that would prevent them from having lung cancer treatment.  If you are pregnant, do not drink alcohol. If you are breastfeeding,  be very cautious about drinking alcohol. If you are not pregnant and choose to drink alcohol, do not have more than 1 drink per day. One drink is considered to be 12 ounces (355 mL) of beer, 5 ounces (148 mL) of wine, or 1.5 ounces (44 mL) of liquor.  Avoid use of street drugs. Do not share needles with anyone. Ask for help if you need support or instructions about stopping the use of drugs.  High blood pressure causes heart disease and increases the risk of  stroke. Your blood pressure should be checked at least every 1 to 2 years. Ongoing high blood pressure should be treated with medicines if weight loss and exercise do not work.  If you are 75-52 years old, ask your health care provider if you should take aspirin to prevent strokes.  Diabetes screening involves taking a blood sample to check your fasting blood sugar level. This should be done once every 3 years, after age 15, if you are within normal weight and without risk factors for diabetes. Testing should be considered at a younger age or be carried out more frequently if you are overweight and have at least 1 risk factor for diabetes.  Breast cancer screening is essential preventive care for women. You should practice "breast self-awareness." This means understanding the normal appearance and feel of your breasts and may include breast self-examination. Any changes detected, no matter how small, should be reported to a health care provider. Women in their 58s and 30s should have a clinical breast exam (CBE) by a health care provider as part of a regular health exam every 1 to 3 years. After age 16, women should have a CBE every year. Starting at age 53, women should consider having a mammogram (breast X-ray test) every year. Women who have a family history of breast cancer should talk to their health care provider about genetic screening. Women at a high risk of breast cancer should talk to their health care providers about having an MRI and a mammogram every year.  Breast cancer gene (BRCA)-related cancer risk assessment is recommended for women who have family members with BRCA-related cancers. BRCA-related cancers include breast, ovarian, tubal, and peritoneal cancers. Having family members with these cancers may be associated with an increased risk for harmful changes (mutations) in the breast cancer genes BRCA1 and BRCA2. Results of the assessment will determine the need for genetic counseling and  BRCA1 and BRCA2 testing.  Routine pelvic exams to screen for cancer are no longer recommended for nonpregnant women who are considered low risk for cancer of the pelvic organs (ovaries, uterus, and vagina) and who do not have symptoms. Ask your health care provider if a screening pelvic exam is right for you.  If you have had past treatment for cervical cancer or a condition that could lead to cancer, you need Pap tests and screening for cancer for at least 20 years after your treatment. If Pap tests have been discontinued, your risk factors (such as having a new sexual partner) need to be reassessed to determine if screening should be resumed. Some women have medical problems that increase the chance of getting cervical cancer. In these cases, your health care provider may recommend more frequent screening and Pap tests.  The HPV test is an additional test that may be used for cervical cancer screening. The HPV test looks for the virus that can cause the cell changes on the cervix. The cells collected during the Pap test can be  tested for HPV. The HPV test could be used to screen women aged 30 years and older, and should be used in women of any age who have unclear Pap test results. After the age of 30, women should have HPV testing at the same frequency as a Pap test.  Colorectal cancer can be detected and often prevented. Most routine colorectal cancer screening begins at the age of 50 years and continues through age 75 years. However, your health care provider may recommend screening at an earlier age if you have risk factors for colon cancer. On a yearly basis, your health care provider may provide home test kits to check for hidden blood in the stool. Use of a small camera at the end of a tube, to directly examine the colon (sigmoidoscopy or colonoscopy), can detect the earliest forms of colorectal cancer. Talk to your health care provider about this at age 50, when routine screening begins. Direct  exam of the colon should be repeated every 5-10 years through age 75 years, unless early forms of pre-cancerous polyps or small growths are found.  People who are at an increased risk for hepatitis B should be screened for this virus. You are considered at high risk for hepatitis B if:  You were born in a country where hepatitis B occurs often. Talk with your health care provider about which countries are considered high risk.  Your parents were born in a high-risk country and you have not received a shot to protect against hepatitis B (hepatitis B vaccine).  You have HIV or AIDS.  You use needles to inject street drugs.  You live with, or have sex with, someone who has hepatitis B.  You get hemodialysis treatment.  You take certain medicines for conditions like cancer, organ transplantation, and autoimmune conditions.  Hepatitis C blood testing is recommended for all people born from 1945 through 1965 and any individual with known risks for hepatitis C.  Practice safe sex. Use condoms and avoid high-risk sexual practices to reduce the spread of sexually transmitted infections (STIs). STIs include gonorrhea, chlamydia, syphilis, trichomonas, herpes, HPV, and human immunodeficiency virus (HIV). Herpes, HIV, and HPV are viral illnesses that have no cure. They can result in disability, cancer, and death.  You should be screened for sexually transmitted illnesses (STIs) including gonorrhea and chlamydia if:  You are sexually active and are younger than 24 years.  You are older than 24 years and your health care provider tells you that you are at risk for this type of infection.  Your sexual activity has changed since you were last screened and you are at an increased risk for chlamydia or gonorrhea. Ask your health care provider if you are at risk.  If you are at risk of being infected with HIV, it is recommended that you take a prescription medicine daily to prevent HIV infection. This is  called preexposure prophylaxis (PrEP). You are considered at risk if:  You are a heterosexual woman, are sexually active, and are at increased risk for HIV infection.  You take drugs by injection.  You are sexually active with a partner who has HIV.  Talk with your health care provider about whether you are at high risk of being infected with HIV. If you choose to begin PrEP, you should first be tested for HIV. You should then be tested every 3 months for as long as you are taking PrEP.  Osteoporosis is a disease in which the bones lose minerals and strength   with aging. This can result in serious bone fractures or breaks. The risk of osteoporosis can be identified using a bone density scan. Women ages 65 years and over and women at risk for fractures or osteoporosis should discuss screening with their health care providers. Ask your health care provider whether you should take a calcium supplement or vitamin D to reduce the rate of osteoporosis.  Menopause can be associated with physical symptoms and risks. Hormone replacement therapy is available to decrease symptoms and risks. You should talk to your health care provider about whether hormone replacement therapy is right for you.  Use sunscreen. Apply sunscreen liberally and repeatedly throughout the day. You should seek shade when your shadow is shorter than you. Protect yourself by wearing long sleeves, pants, a wide-brimmed hat, and sunglasses year round, whenever you are outdoors.  Once a month, do a whole body skin exam, using a mirror to look at the skin on your back. Tell your health care provider of new moles, moles that have irregular borders, moles that are larger than a pencil eraser, or moles that have changed in shape or color.  Stay current with required vaccines (immunizations).  Influenza vaccine. All adults should be immunized every year.  Tetanus, diphtheria, and acellular pertussis (Td, Tdap) vaccine. Pregnant women should  receive 1 dose of Tdap vaccine during each pregnancy. The dose should be obtained regardless of the length of time since the last dose. Immunization is preferred during the 27th-36th week of gestation. An adult who has not previously received Tdap or who does not know her vaccine status should receive 1 dose of Tdap. This initial dose should be followed by tetanus and diphtheria toxoids (Td) booster doses every 10 years. Adults with an unknown or incomplete history of completing a 3-dose immunization series with Td-containing vaccines should begin or complete a primary immunization series including a Tdap dose. Adults should receive a Td booster every 10 years.  Varicella vaccine. An adult without evidence of immunity to varicella should receive 2 doses or a second dose if she has previously received 1 dose. Pregnant females who do not have evidence of immunity should receive the first dose after pregnancy. This first dose should be obtained before leaving the health care facility. The second dose should be obtained 4-8 weeks after the first dose.  Human papillomavirus (HPV) vaccine. Females aged 13-26 years who have not received the vaccine previously should obtain the 3-dose series. The vaccine is not recommended for use in pregnant females. However, pregnancy testing is not needed before receiving a dose. If a female is found to be pregnant after receiving a dose, no treatment is needed. In that case, the remaining doses should be delayed until after the pregnancy. Immunization is recommended for any person with an immunocompromised condition through the age of 26 years if she did not get any or all doses earlier. During the 3-dose series, the second dose should be obtained 4-8 weeks after the first dose. The third dose should be obtained 24 weeks after the first dose and 16 weeks after the second dose.  Zoster vaccine. One dose is recommended for adults aged 60 years or older unless certain conditions are  present.  Measles, mumps, and rubella (MMR) vaccine. Adults born before 1957 generally are considered immune to measles and mumps. Adults born in 1957 or later should have 1 or more doses of MMR vaccine unless there is a contraindication to the vaccine or there is laboratory evidence of immunity to   each of the three diseases. A routine second dose of MMR vaccine should be obtained at least 28 days after the first dose for students attending postsecondary schools, health care workers, or international travelers. People who received inactivated measles vaccine or an unknown type of measles vaccine during 1963-1967 should receive 2 doses of MMR vaccine. People who received inactivated mumps vaccine or an unknown type of mumps vaccine before 1979 and are at high risk for mumps infection should consider immunization with 2 doses of MMR vaccine. For females of childbearing age, rubella immunity should be determined. If there is no evidence of immunity, females who are not pregnant should be vaccinated. If there is no evidence of immunity, females who are pregnant should delay immunization until after pregnancy. Unvaccinated health care workers born before 1957 who lack laboratory evidence of measles, mumps, or rubella immunity or laboratory confirmation of disease should consider measles and mumps immunization with 2 doses of MMR vaccine or rubella immunization with 1 dose of MMR vaccine.  Pneumococcal 13-valent conjugate (PCV13) vaccine. When indicated, a person who is uncertain of her immunization history and has no record of immunization should receive the PCV13 vaccine. An adult aged 19 years or older who has certain medical conditions and has not been previously immunized should receive 1 dose of PCV13 vaccine. This PCV13 should be followed with a dose of pneumococcal polysaccharide (PPSV23) vaccine. The PPSV23 vaccine dose should be obtained at least 8 weeks after the dose of PCV13 vaccine. An adult aged 19  years or older who has certain medical conditions and previously received 1 or more doses of PPSV23 vaccine should receive 1 dose of PCV13. The PCV13 vaccine dose should be obtained 1 or more years after the last PPSV23 vaccine dose.  Pneumococcal polysaccharide (PPSV23) vaccine. When PCV13 is also indicated, PCV13 should be obtained first. All adults aged 65 years and older should be immunized. An adult younger than age 65 years who has certain medical conditions should be immunized. Any person who resides in a nursing home or long-term care facility should be immunized. An adult smoker should be immunized. People with an immunocompromised condition and certain other conditions should receive both PCV13 and PPSV23 vaccines. People with human immunodeficiency virus (HIV) infection should be immunized as soon as possible after diagnosis. Immunization during chemotherapy or radiation therapy should be avoided. Routine use of PPSV23 vaccine is not recommended for American Indians, [place] Natives, or people younger than 65 years unless there are medical conditions that require PPSV23 vaccine. When indicated, people who have unknown immunization and have no record of immunization should receive PPSV23 vaccine. One-time revaccination 5 years after the first dose of PPSV23 is recommended for people aged 19-64 years who have chronic kidney failure, nephrotic syndrome, asplenia, or immunocompromised conditions. People who received 1-2 doses of PPSV23 before age 65 years should receive another dose of PPSV23 vaccine at age 65 years or later if at least 5 years have passed since the previous dose. Doses of PPSV23 are not needed for people immunized with PPSV23 at or after age 65 years.  Meningococcal vaccine. Adults with asplenia or persistent complement component deficiencies should receive 2 doses of quadrivalent meningococcal conjugate (MenACWY-D) vaccine. The doses should be obtained at least 2 months apart.  Microbiologists working with certain meningococcal bacteria, military recruits, people at risk during an outbreak, and people who travel to or live in countries with a high rate of meningitis should be immunized. A first-year college student up through age   21 years who is living in a residence hall should receive a dose if she did not receive a dose on or after her 16th birthday. Adults who have certain high-risk conditions should receive one or more doses of vaccine.  Hepatitis A vaccine. Adults who wish to be protected from this disease, have certain high-risk conditions, work with hepatitis A-infected animals, work in hepatitis A research labs, or travel to or work in countries with a high rate of hepatitis A should be immunized. Adults who were previously unvaccinated and who anticipate close contact with an international adoptee during the first 60 days after arrival in the [place] from a country with a high rate of hepatitis A should be immunized.  Hepatitis B vaccine. Adults who wish to be protected from this disease, have certain high-risk conditions, may be exposed to blood or other infectious body fluids, are household contacts or sex partners of hepatitis B positive people, are clients or workers in certain care facilities, or travel to or work in countries with a high rate of hepatitis B should be immunized.  Haemophilus influenzae type b (Hib) vaccine. A previously unvaccinated person with asplenia or sickle cell disease or having a scheduled splenectomy should receive 1 dose of Hib vaccine. Regardless of previous immunization, a recipient of a hematopoietic stem cell transplant should receive a 3-dose series 6-12 months after her successful transplant. Hib vaccine is not recommended for adults with HIV infection. Preventive Services / Frequency Ages 64 to 68 years  Blood pressure check.** / Every 1 to 2 years.  Lipid and cholesterol check.** / Every 5 years beginning at age  22.  Clinical breast exam.** / Every 3 years for women in their 88s and 53s.  BRCA-related cancer risk assessment.** / For women who have family members with a BRCA-related cancer (breast, ovarian, tubal, or peritoneal cancers).  Pap test.** / Every 2 years from ages 90 through 51. Every 3 years starting at age 21 through age 56 or 3 with a history of 3 consecutive normal Pap tests.  HPV screening.** / Every 3 years from ages 24 through ages 1 to 46 with a history of 3 consecutive normal Pap tests.  Hepatitis C blood test.** / For any individual with known risks for hepatitis C.  Skin self-exam. / Monthly.  Influenza vaccine. / Every year.  Tetanus, diphtheria, and acellular pertussis (Tdap, Td) vaccine.** / Consult your health care provider. Pregnant women should receive 1 dose of Tdap vaccine during each pregnancy. 1 dose of Td every 10 years.  Varicella vaccine.** / Consult your health care provider. Pregnant females who do not have evidence of immunity should receive the first dose after pregnancy.  HPV vaccine. / 3 doses over 6 months, if 72 and younger. The vaccine is not recommended for use in pregnant females. However, pregnancy testing is not needed before receiving a dose.  Measles, mumps, rubella (MMR) vaccine.** / You need at least 1 dose of MMR if you were born in 1957 or later. You may also need a 2nd dose. For females of childbearing age, rubella immunity should be determined. If there is no evidence of immunity, females who are not pregnant should be vaccinated. If there is no evidence of immunity, females who are pregnant should delay immunization until after pregnancy.  Pneumococcal 13-valent conjugate (PCV13) vaccine.** / Consult your health care provider.  Pneumococcal polysaccharide (PPSV23) vaccine.** / 1 to 2 doses if you smoke cigarettes or if you have certain conditions.  Meningococcal vaccine.** /  1 dose if you are age 19 to 21 years and a first-year college  student living in a residence hall, or have one of several medical conditions, you need to get vaccinated against meningococcal disease. You may also need additional booster doses.  Hepatitis A vaccine.** / Consult your health care provider.  Hepatitis B vaccine.** / Consult your health care provider.  Haemophilus influenzae type b (Hib) vaccine.** / Consult your health care provider. Ages 40 to 64 years  Blood pressure check.** / Every 1 to 2 years.  Lipid and cholesterol check.** / Every 5 years beginning at age 20 years.  Lung cancer screening. / Every year if you are aged 55-80 years and have a 30-pack-year history of smoking and currently smoke or have quit within the past 15 years. Yearly screening is stopped once you have quit smoking for at least 15 years or develop a health problem that would prevent you from having lung cancer treatment.  Clinical breast exam.** / Every year after age 40 years.  BRCA-related cancer risk assessment.** / For women who have family members with a BRCA-related cancer (breast, ovarian, tubal, or peritoneal cancers).  Mammogram.** / Every year beginning at age 40 years and continuing for as long as you are in good health. Consult with your health care provider.  Pap test.** / Every 3 years starting at age 30 years through age 65 or 70 years with a history of 3 consecutive normal Pap tests.  HPV screening.** / Every 3 years from ages 30 years through ages 65 to 70 years with a history of 3 consecutive normal Pap tests.  Fecal occult blood test (FOBT) of stool. / Every year beginning at age 50 years and continuing until age 75 years. You may not need to do this test if you get a colonoscopy every 10 years.  Flexible sigmoidoscopy or colonoscopy.** / Every 5 years for a flexible sigmoidoscopy or every 10 years for a colonoscopy beginning at age 50 years and continuing until age 75 years.  Hepatitis C blood test.** / For all people born from 1945 through  1965 and any individual with known risks for hepatitis C.  Skin self-exam. / Monthly.  Influenza vaccine. / Every year.  Tetanus, diphtheria, and acellular pertussis (Tdap/Td) vaccine.** / Consult your health care provider. Pregnant women should receive 1 dose of Tdap vaccine during each pregnancy. 1 dose of Td every 10 years.  Varicella vaccine.** / Consult your health care provider. Pregnant females who do not have evidence of immunity should receive the first dose after pregnancy.  Zoster vaccine.** / 1 dose for adults aged 60 years or older.  Measles, mumps, rubella (MMR) vaccine.** / You need at least 1 dose of MMR if you were born in 1957 or later. You may also need a 2nd dose. For females of childbearing age, rubella immunity should be determined. If there is no evidence of immunity, females who are not pregnant should be vaccinated. If there is no evidence of immunity, females who are pregnant should delay immunization until after pregnancy.  Pneumococcal 13-valent conjugate (PCV13) vaccine.** / Consult your health care provider.  Pneumococcal polysaccharide (PPSV23) vaccine.** / 1 to 2 doses if you smoke cigarettes or if you have certain conditions.  Meningococcal vaccine.** / Consult your health care provider.  Hepatitis A vaccine.** / Consult your health care provider.  Hepatitis B vaccine.** / Consult your health care provider.  Haemophilus influenzae type b (Hib) vaccine.** / Consult your health care provider. Ages 65   years and over  Blood pressure check.** / Every 1 to 2 years.  Lipid and cholesterol check.** / Every 5 years beginning at age 22 years.  Lung cancer screening. / Every year if you are aged 73-80 years and have a 30-pack-year history of smoking and currently smoke or have quit within the past 15 years. Yearly screening is stopped once you have quit smoking for at least 15 years or develop a health problem that would prevent you from having lung cancer  treatment.  Clinical breast exam.** / Every year after age 4 years.  BRCA-related cancer risk assessment.** / For women who have family members with a BRCA-related cancer (breast, ovarian, tubal, or peritoneal cancers).  Mammogram.** / Every year beginning at age 40 years and continuing for as long as you are in good health. Consult with your health care provider.  Pap test.** / Every 3 years starting at age 9 years through age 34 or 91 years with 3 consecutive normal Pap tests. Testing can be stopped between 65 and 70 years with 3 consecutive normal Pap tests and no abnormal Pap or HPV tests in the past 10 years.  HPV screening.** / Every 3 years from ages 57 years through ages 64 or 45 years with a history of 3 consecutive normal Pap tests. Testing can be stopped between 65 and 70 years with 3 consecutive normal Pap tests and no abnormal Pap or HPV tests in the past 10 years.  Fecal occult blood test (FOBT) of stool. / Every year beginning at age 15 years and continuing until age 17 years. You may not need to do this test if you get a colonoscopy every 10 years.  Flexible sigmoidoscopy or colonoscopy.** / Every 5 years for a flexible sigmoidoscopy or every 10 years for a colonoscopy beginning at age 86 years and continuing until age 71 years.  Hepatitis C blood test.** / For all people born from [birth year] through 1965 and any individual with known risks for hepatitis C.  Osteoporosis screening.** / A one-time screening for women ages 83 years and over and women at risk for fractures or osteoporosis.  Skin self-exam. / Monthly.  Influenza vaccine. / Every year.  Tetanus, diphtheria, and acellular pertussis (Tdap/Td) vaccine.** / 1 dose of Td every 10 years.  Varicella vaccine.** / Consult your health care provider.  Zoster vaccine.** / 1 dose for adults aged 61 years or older.  Pneumococcal 13-valent conjugate (PCV13) vaccine.** / Consult your health care provider.  Pneumococcal  polysaccharide (PPSV23) vaccine.** / 1 dose for all adults aged 28 years and older.  Meningococcal vaccine.** / Consult your health care provider.  Hepatitis A vaccine.** / Consult your health care provider.  Hepatitis B vaccine.** / Consult your health care provider.  Haemophilus influenzae type b (Hib) vaccine.** / Consult your health care provider. ** Family history and personal history of risk and conditions may change your health care provider's recommendations. Document Released: 03/19/2001 Document Revised: 06/07/2013 Document Reviewed: 06/18/2010 [facility] Patient Information 2015 [place], [place]. This information is not intended to replace advice given to you by your health care provider. Make sure you discuss any questions you have with your health care provider.

## 2014-02-09 NOTE — Progress Notes (Signed)
Subjective:    Patient ID: Rebecca MemoryStephanie Lawson, female    DOB: 11/05/1985, 29 y.o.   MRN: [id number]  Palpitations  This is a chronic problem. The current episode started more than 1 year ago. The problem occurs intermittently. The problem has been unchanged. The symptoms are aggravated by stress and fear. Associated symptoms include anxiety. Pertinent negatives include no chest fullness, chest pain, coughing, diaphoresis, dizziness, fever, irregular heartbeat, malaise/fatigue, nausea, near-syncope, numbness, shortness of breath, syncope, vomiting or weakness. Associated symptoms comments: She feels a rare thump in her chest. She has tried anxiolytics for the symptoms. The treatment provided significant relief. Her past medical history is significant for anxiety. There is no history of anemia, drug use, heart disease, hyperthyroidism or a valve disorder.      Review of Systems  Constitutional: Negative.  Negative for fever, chills, malaise/fatigue, diaphoresis, appetite change and fatigue.  HENT: Negative.   Eyes: Negative.   Respiratory: Negative.  Negative for cough, choking, chest tightness and shortness of breath.   Cardiovascular: Positive for palpitations. Negative for chest pain, leg swelling, syncope and near-syncope.  Gastrointestinal: Negative.  Negative for nausea, vomiting, abdominal pain, diarrhea and constipation.  Endocrine: Negative.   Genitourinary: Negative.   Musculoskeletal: Negative.   Skin: Negative.   Allergic/Immunologic: Negative.   Neurological: Negative.  Negative for dizziness, weakness and numbness.  Hematological: Negative.  Negative for adenopathy. Does not bruise/bleed easily.  Psychiatric/Behavioral: Positive for dysphoric mood. Negative for suicidal ideas and sleep disturbance. The patient is nervous/anxious.        Objective:   Physical Exam  Constitutional: She is oriented to person, place, and time. She appears well-developed and well-nourished. No  distress.  HENT:  Head: Normocephalic and atraumatic.  Mouth/Throat: Oropharynx is clear and moist. No oropharyngeal exudate.  Eyes: Conjunctivae are normal. Right eye exhibits no discharge. Left eye exhibits no discharge. No scleral icterus.  Neck: Normal range of motion. Neck supple. No JVD present. No tracheal deviation present. No thyromegaly present.  Cardiovascular: Normal rate, regular rhythm, normal heart sounds and intact distal pulses.  Exam reveals no gallop and no friction rub.   No murmur heard. Pulmonary/Chest: Effort normal and breath sounds normal. No stridor. No respiratory distress. She has no wheezes. She has no rales. She exhibits no tenderness.  Abdominal: Soft. Bowel sounds are normal. She exhibits no distension and no mass. There is no tenderness. There is no rebound and no guarding.  Musculoskeletal: Normal range of motion. She exhibits no edema or tenderness.  Lymphadenopathy:    She has no cervical adenopathy.  Neurological: She is oriented to person, place, and time.  Skin: Skin is warm and dry. No rash noted. She is not diaphoretic. No erythema. No pallor.  Psychiatric: She has a normal mood and affect. Her behavior is normal. Judgment and thought content normal.  Vitals reviewed.    Lab Results  Component Value Date   WBC 11.0* 02/17/2012   HGB 13.8 02/17/2012   HCT 40.5 02/17/2012   PLT 401.0* 02/17/2012   GLUCOSE 93 02/17/2012   CHOL 220* 02/17/2012   TRIG 176.0* 02/17/2012   HDL 47.40 02/17/2012   LDLDIRECT 139.8 02/17/2012   ALT 29 02/17/2012   AST 20 02/17/2012   NA 138 02/17/2012   K 4.7 02/17/2012   CL 101 02/17/2012   CREATININE 0.7 02/17/2012   BUN 10 02/17/2012   CO2 29 02/17/2012   TSH 0.89 02/17/2012       Assessment & Plan:

## 2014-02-10 DIAGNOSIS — R002 Palpitations: Secondary | ICD-10-CM | POA: Insufficient documentation

## 2014-02-10 NOTE — Assessment & Plan Note (Signed)
Exam done Vaccines were reviewed and updated PAP have been done recently Labs ordered Pt ed material was given

## 2014-02-10 NOTE — Assessment & Plan Note (Signed)
Her EKG shows NSR These sensations appears to be benign Will follow

## 2014-02-11 ENCOUNTER — Encounter: Payer: Self-pay | Admitting: Internal Medicine

## 2014-02-14 ENCOUNTER — Telehealth: Payer: Self-pay | Admitting: Internal Medicine

## 2014-02-14 DIAGNOSIS — D729 Disorder of white blood cells, unspecified: Secondary | ICD-10-CM

## 2014-02-14 DIAGNOSIS — D691 Qualitative platelet defects: Secondary | ICD-10-CM

## 2014-02-14 NOTE — Telephone Encounter (Signed)
Pt anxious for lab results, requesting labs.

## 2014-02-14 NOTE — Telephone Encounter (Signed)
A letter was sent.

## 2014-02-14 NOTE — Telephone Encounter (Signed)
In 2-3 weeks

## 2014-02-14 NOTE — Telephone Encounter (Signed)
Called pt gave her md response on lab letter. When do you want pt to come back to have her cbc check...Raechel Chute/lmb

## 2014-02-14 NOTE — Telephone Encounter (Signed)
Notified pt with md response.../lmb 

## 2014-02-23 ENCOUNTER — Other Ambulatory Visit (INDEPENDENT_AMBULATORY_CARE_PROVIDER_SITE_OTHER): Payer: BLUE CROSS/BLUE SHIELD

## 2014-02-23 DIAGNOSIS — D691 Qualitative platelet defects: Secondary | ICD-10-CM

## 2014-02-23 DIAGNOSIS — D729 Disorder of white blood cells, unspecified: Secondary | ICD-10-CM

## 2014-02-23 LAB — CBC WITH DIFFERENTIAL/PLATELET
BASOS ABS: 0.1 10*3/uL (ref 0.0–0.1)
BASOS PCT: 0.5 % (ref 0.0–3.0)
EOS PCT: 0.5 % (ref 0.0–5.0)
Eosinophils Absolute: 0.1 10*3/uL (ref 0.0–0.7)
HEMATOCRIT: 39.9 % (ref 36.0–46.0)
Hemoglobin: 13.6 g/dL (ref 12.0–15.0)
LYMPHS ABS: 2.9 10*3/uL (ref 0.7–4.0)
Lymphocytes Relative: 23.8 % (ref 12.0–46.0)
MCHC: 34.1 g/dL (ref 30.0–36.0)
MCV: 81.7 fl (ref 78.0–100.0)
MONO ABS: 1 10*3/uL (ref 0.1–1.0)
Monocytes Relative: 8.3 % (ref 3.0–12.0)
NEUTROS ABS: 8.3 10*3/uL — AB (ref 1.4–7.7)
NEUTROS PCT: 66.9 % (ref 43.0–77.0)
Platelets: 494 10*3/uL — ABNORMAL HIGH (ref 150.0–400.0)
RBC: 4.88 Mil/uL (ref 3.87–5.11)
RDW: 13.8 % (ref 11.5–15.5)
WBC: 12.3 10*3/uL — ABNORMAL HIGH (ref 4.0–10.5)

## 2014-02-28 ENCOUNTER — Encounter: Payer: Self-pay | Admitting: Internal Medicine

## 2014-03-03 ENCOUNTER — Telehealth: Payer: Self-pay | Admitting: Internal Medicine

## 2014-03-07 NOTE — Telephone Encounter (Signed)
Pt called in requesting call back from nurse.  She has some questions about her labs she needed to speak to someone about.

## 2014-03-08 ENCOUNTER — Telehealth: Payer: Self-pay | Admitting: Internal Medicine

## 2014-03-08 NOTE — Telephone Encounter (Signed)
Pt request phone call from the assistant concern about test result. Pt need clarification. Please call her ASAP

## 2014-06-15 ENCOUNTER — Ambulatory Visit (INDEPENDENT_AMBULATORY_CARE_PROVIDER_SITE_OTHER): Payer: BLUE CROSS/BLUE SHIELD | Admitting: Physician Assistant

## 2014-06-15 VITALS — BP 106/68 | HR 72 | Temp 98.4°F | Resp 16 | Ht 64.0 in | Wt 241.0 lb

## 2014-06-15 DIAGNOSIS — M7711 Lateral epicondylitis, right elbow: Secondary | ICD-10-CM | POA: Diagnosis not present

## 2014-06-15 DIAGNOSIS — M7712 Lateral epicondylitis, left elbow: Secondary | ICD-10-CM | POA: Diagnosis not present

## 2014-06-15 MED ORDER — MELOXICAM 7.5 MG PO TABS: 7.5000 mg | ORAL_TABLET | Freq: Every day | ORAL | Status: DC

## 2014-06-15 NOTE — Progress Notes (Signed)
   Subjective:    Patient ID: Rebecca Lawson, female    DOB: 02/09/1985, 29 y.o.   MRN: [id number]  Chief Complaint  Patient presents with  . Elbow Pain    x 1-2 months   Patient Active Problem List   Diagnosis Date Noted  . Lateral epicondylitis of both elbows 06/15/2014  . Palpitations 02/10/2014  . ANXIETY 04/15/2010  . DEPRESSION 04/15/2010   Prior to Admission medications   Medication Sig Start Date End Date Taking? Authorizing Provider  clonazePAM (KLONOPIN) 0.5 MG tablet Take by mouth as needed for anxiety. 1/2-1 tablet as needed   Yes Historical Provider, MD  Levonorgestrel (SKYLA) 13.5 MG IUD by Intrauterine route. 05/20/12  Yes Etta Grandchildhomas L Jones, MD  sertraline (ZOLOFT) 25 MG tablet Take 25 mg by mouth 2 (two) times daily.   Yes Historical Provider, MD  meloxicam (MOBIC) 7.5 MG tablet Take 1 tablet (7.5 mg total) by mouth daily. 06/15/14   Raelyn Ensignodd Lida Berkery, PA   Medications, allergies, past medical history, surgical history, family history, social history and problem list reviewed and updated.  HPI  29 yof presents with bilateral elbow pain.   Sx started approx 2 months ago. 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 This is persistent most evenings. Described as ache. Better when she wakes in the am. Feels sore when she presses over the area. Denies weakness, numbness. 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 This positioning has been ongoing for months. No other new activities. She does play stand up bass for past 6 months.   Ice and ibuprofen have helped a bit.   Review of Systems No fevers, chills.     Objective:   Physical Exam  Constitutional: She is oriented to person, place, and time. She appears well-developed and well-nourished.  Non-toxic appearance. She does not have a sickly appearance. She does not appear ill. No distress.  BP 106/68 mmHg  Pulse 72   Temp(Src) 98.4 F (36.9 C)  Resp 16  Ht 5\' 4"  (1.626 m)  Wt 241 lb (109.317 kg)  BMI 41.35 kg/m2  SpO2 97%  LMP 06/06/2014   Musculoskeletal:       Right elbow: She exhibits normal range of motion and no swelling. Tenderness found. Lateral epicondyle tenderness noted.       Left elbow: She exhibits normal range of motion and no swelling. Tenderness found. Lateral epicondyle tenderness noted.  Point tenderness lateral extensors bilaterally. Pain with resisted external rotation bilaterally. Full rom. Normal strength, normal sensation.   Neurological: She is alert and oriented to person, place, and time.      Assessment & Plan:   [age] yof presents with bilateral elbow pain.   Lateral epicondylitis of both elbows - Plan: meloxicam (MOBIC) 7.5 MG tablet --recommended counter force bracing --recommended new posture while at desk as this has likely led to the condition --meloxicam qd 3 wks --ice after work and irritating activities --list of exercises given --rtc 2 months if no relief with these measures  Donnajean Lopesodd M. Jacque Byron, PA-C Physician Assistant-Certified [facility]  06/15/2014 3:31 PM

## 2014-06-15 NOTE — Patient Instructions (Signed)
Please take the mobic once daily for the next 2-3 weeks. Please ice after activities that are irritating. Please purchase a "counter force brace" to wear for approx the next 6 weeks. Please try to maintain your new ergo dynamics at work to see if this minimizes the pain.  Please do the exercises below. Please come back to see [place] if you're not better in 2 months for further workup.   Lateral Epicondylitis (Tennis Elbow) with Rehab Lateral epicondylitis involves inflammation and pain around the outer portion of the elbow. The pain is caused by inflammation of the tendons in the forearm that bring back (extend) the wrist. Lateral epicondylitis is also called tennis elbow, because it is very common in tennis players. However, it may occur in any individual who extends the wrist repetitively. If lateral epicondylitis is left untreated, it may become a chronic problem. SYMPTOMS   Pain, tenderness, and inflammation on the outer (lateral) side of the elbow.  Pain or weakness with gripping activities.  Pain that increases with wrist-twisting motions (playing tennis, using a screwdriver, opening a door or a jar).  Pain with lifting objects, including a coffee cup. CAUSES  Lateral epicondylitis is caused by inflammation of the tendons that extend the wrist. Causes of injury may include:  Repetitive stress and strain on the muscles and tendons that extend the wrist.  Sudden change in activity level or intensity.  Incorrect grip in racquet sports.  Incorrect grip size of racquet (often too large).  Incorrect hitting position or technique (usually backhand, leading with the elbow).  Using a racket that is too heavy. RISK INCREASES WITH:  Sports or occupations that require repetitive and/or strenuous forearm and wrist movements (tennis, squash, racquetball, carpentry).  Poor wrist and forearm strength and flexibility.  Failure to warm up properly before activity.  Resuming activity before  healing, rehabilitation, and conditioning are complete. PREVENTION   Warm up and stretch properly before activity.  Maintain physical fitness:  Strength, flexibility, and endurance.  Cardiovascular fitness.  Wear and use properly fitted equipment.  Learn and use proper technique and have a coach correct improper technique.  Wear a tennis elbow (counterforce) brace. PROGNOSIS  The course of this condition depends on the degree of the injury. If treated properly, acute cases (symptoms lasting less than 4 weeks) are often resolved in 2 to 6 weeks. Chronic (longer lasting cases) often resolve in 3 to 6 months but may require physical therapy. RELATED COMPLICATIONS   Frequently recurring symptoms, resulting in a chronic problem. Properly treating the problem the first time decreases frequency of recurrence.  Chronic inflammation, scarring tendon degeneration, and partial tendon tear, requiring surgery.  Delayed healing or resolution of symptoms. TREATMENT  Treatment first involves the use of ice and medicine to reduce pain and inflammation. Strengthening and stretching exercises may help reduce discomfort if performed regularly. These exercises may be performed at home if the condition is an acute injury. Chronic cases may require a referral to a physical therapist for evaluation and treatment. Your caregiver may advise a corticosteroid injection to help reduce inflammation. Rarely, surgery is needed. MEDICATION  If pain medicine is needed, nonsteroidal anti-inflammatory medicines (aspirin and ibuprofen), or other minor pain relievers (acetaminophen), are often advised.  Do not take pain medicine for 7 days before surgery.  Prescription pain relievers may be given, if your caregiver thinks they are needed. Use only as directed and only as much as you need.  Corticosteroid injections may be recommended. These injections should be  reserved only for the most severe cases, because they can  only be given a certain number of times. HEAT AND COLD  Cold treatment (icing) should be applied for 10 to 15 minutes every 2 to 3 hours for inflammation and pain, and immediately after activity that aggravates your symptoms. Use ice packs or an ice massage.  Heat treatment may be used before performing stretching and strengthening activities prescribed by your caregiver, physical therapist, or athletic trainer. Use a heat pack or a warm water soak. SEEK MEDICAL CARE IF: Symptoms get worse or do not improve in 2 weeks, despite treatment. EXERCISES  RANGE OF MOTION (ROM) AND STRETCHING EXERCISES - Epicondylitis, Lateral (Tennis Elbow) These exercises may help you when beginning to rehabilitate your injury. Your symptoms may go away with or without further involvement from your physician, physical therapist, or athletic trainer. While completing these exercises, remember:   Restoring tissue flexibility helps normal motion to return to the joints. This allows healthier, less painful movement and activity.  An effective stretch should be held for at least 30 seconds.  A stretch should never be painful. You should only feel a gentle lengthening or release in the stretched tissue. RANGE OF MOTION - Wrist Flexion, Active-Assisted  Extend your right / left elbow with your fingers pointing down.*  Gently pull the back of your hand towards you, until you feel a gentle stretch on the top of your forearm.  Hold this position for __________ seconds. Repeat __________ times. Complete this exercise __________ times per day.  *If directed by your physician, physical therapist or athletic trainer, complete this stretch with your elbow bent, rather than extended. RANGE OF MOTION - Wrist Extension, Active-Assisted  Extend your right / left elbow and turn your palm upwards.*  Gently pull your palm and fingertips back, so your wrist extends and your fingers point more toward the ground.  You should feel a  gentle stretch on the inside of your forearm.  Hold this position for __________ seconds. Repeat __________ times. Complete this exercise __________ times per day. *If directed by your physician, physical therapist or athletic trainer, complete this stretch with your elbow bent, rather than extended. STRETCH - Wrist Flexion  Place the back of your right / left hand on a tabletop, leaving your elbow slightly bent. Your fingers should point away from your body.  Gently press the back of your hand down onto the table by straightening your elbow. You should feel a stretch on the top of your forearm.  Hold this position for __________ seconds. Repeat __________ times. Complete this stretch __________ times per day.  STRETCH - Wrist Extension   Place your right / left fingertips on a tabletop, leaving your elbow slightly bent. Your fingers should point backwards.  Gently press your fingers and palm down onto the table by straightening your elbow. You should feel a stretch on the inside of your forearm.  Hold this position for __________ seconds. Repeat __________ times. Complete this stretch __________ times per day.  STRENGTHENING EXERCISES - Epicondylitis, Lateral (Tennis Elbow) These exercises may help you when beginning to rehabilitate your injury. They may resolve your symptoms with or without further involvement from your physician, physical therapist, or athletic trainer. While completing these exercises, remember:   Muscles can gain both the endurance and the strength needed for everyday activities through controlled exercises.  Complete these exercises as instructed by your physician, physical therapist or athletic trainer. Increase the resistance and repetitions only as guided.  You may experience muscle soreness or fatigue, but the pain or discomfort you are trying to eliminate should never worsen during these exercises. If this pain does get worse, stop and make sure you are  following the directions exactly. If the pain is still present after adjustments, discontinue the exercise until you can discuss the trouble with your caregiver. STRENGTH - Wrist Flexors  Sit with your right / left forearm palm-up and fully supported on a table or countertop. Your elbow should be resting below the height of your shoulder. Allow your wrist to extend over the edge of the surface.  Loosely holding a __________ weight, or a piece of rubber exercise band or tubing, slowly curl your hand up toward your forearm.  Hold this position for __________ seconds. Slowly lower the wrist back to the starting position in a controlled manner. Repeat __________ times. Complete this exercise __________ times per day.  STRENGTH - Wrist Extensors  Sit with your right / left forearm palm-down and fully supported on a table or countertop. Your elbow should be resting below the height of your shoulder. Allow your wrist to extend over the edge of the surface.  Loosely holding a __________ weight, or a piece of rubber exercise band or tubing, slowly curl your hand up toward your forearm.  Hold this position for __________ seconds. Slowly lower the wrist back to the starting position in a controlled manner. Repeat __________ times. Complete this exercise __________ times per day.  STRENGTH - Ulnar Deviators  Stand with a ____________________ weight in your right / left hand, or sit while holding a rubber exercise band or tubing, with your healthy arm supported on a table or countertop.  Move your wrist, so that your pinkie travels toward your forearm and your thumb moves away from your forearm.  Hold this position for __________ seconds and then slowly lower the wrist back to the starting position. Repeat __________ times. Complete this exercise __________ times per day STRENGTH - Radial Deviators  Stand with a ____________________ weight in your right / left hand, or sit while holding a rubber  exercise band or tubing, with your injured arm supported on a table or countertop.  Raise your hand upward in front of you or pull up on the rubber tubing.  Hold this position for __________ seconds and then slowly lower the wrist back to the starting position. Repeat __________ times. Complete this exercise __________ times per day. STRENGTH - Forearm Supinators   Sit with your right / left forearm supported on a table, keeping your elbow below shoulder height. Rest your hand over the edge, palm down.  Gently grip a hammer or a soup ladle.  Without moving your elbow, slowly turn your palm and hand upward to a "thumbs-up" position.  Hold this position for __________ seconds. Slowly return to the starting position. Repeat __________ times. Complete this exercise __________ times per day.  STRENGTH - Forearm Pronators   Sit with your right / left forearm supported on a table, keeping your elbow below shoulder height. Rest your hand over the edge, palm up.  Gently grip a hammer or a soup ladle.  Without moving your elbow, slowly turn your palm and hand upward to a "thumbs-up" position.  Hold this position for __________ seconds. Slowly return to the starting position. Repeat __________ times. Complete this exercise __________ times per day.  STRENGTH - Grip  Grasp a tennis ball, a dense sponge, or a large, rolled sock in your hand.  Squeeze  as hard as you can, without increasing any pain.  Hold this position for __________ seconds. Release your grip slowly. Repeat __________ times. Complete this exercise __________ times per day.  STRENGTH - Elbow Extensors, Isometric  Stand or sit upright, on a firm surface. Place your right / left arm so that your palm faces your stomach, and it is at the height of your waist.  Place your opposite hand on the underside of your forearm. Gently push up as your right / left arm resists. Push as hard as you can with both arms, without causing any  pain or movement at your right / left elbow. Hold this stationary position for __________ seconds. Gradually release the tension in both arms. Allow your muscles to relax completely before repeating. Document Released: 01/21/2005 Document Revised: 06/07/2013 Document Reviewed: 05/05/2008 [facility] Patient Information 2015 [place], [place]. This information is not intended to replace advice given to you by your health care provider. Make sure you discuss any questions you have with your health care provider.

## 2014-07-08 ENCOUNTER — Other Ambulatory Visit: Payer: Self-pay | Admitting: Physician Assistant

## 2014-07-11 NOTE — Telephone Encounter (Signed)
Todd, do you want to give RFs, or RTC?

## 2015-06-20 LAB — HM PAP SMEAR

## 2016-04-30 ENCOUNTER — Encounter: Payer: BLUE CROSS/BLUE SHIELD | Admitting: Internal Medicine

## 2016-05-27 ENCOUNTER — Other Ambulatory Visit (INDEPENDENT_AMBULATORY_CARE_PROVIDER_SITE_OTHER): Payer: 59

## 2016-05-27 ENCOUNTER — Encounter: Payer: Self-pay | Admitting: Internal Medicine

## 2016-05-27 ENCOUNTER — Ambulatory Visit (INDEPENDENT_AMBULATORY_CARE_PROVIDER_SITE_OTHER): Payer: 59 | Admitting: Internal Medicine

## 2016-05-27 VITALS — BP 110/80 | HR 90 | Temp 98.6°F | Ht 64.0 in | Wt 257.1 lb

## 2016-05-27 DIAGNOSIS — R7989 Other specified abnormal findings of blood chemistry: Secondary | ICD-10-CM

## 2016-05-27 DIAGNOSIS — G40209 Localization-related (focal) (partial) symptomatic epilepsy and epileptic syndromes with complex partial seizures, not intractable, without status epilepticus: Secondary | ICD-10-CM | POA: Insufficient documentation

## 2016-05-27 DIAGNOSIS — Z0000 Encounter for general adult medical examination without abnormal findings: Secondary | ICD-10-CM | POA: Diagnosis not present

## 2016-05-27 DIAGNOSIS — D72829 Elevated white blood cell count, unspecified: Secondary | ICD-10-CM | POA: Diagnosis not present

## 2016-05-27 LAB — LIPID PANEL
CHOLESTEROL: 236 mg/dL — AB (ref 0–200)
HDL: 35.3 mg/dL — ABNORMAL LOW (ref >39.00)
NonHDL: 200.86
Total CHOL/HDL Ratio: 7
Triglycerides: 288 mg/dL — ABNORMAL HIGH (ref 0.0–149.0)
VLDL: 57.6 mg/dL — AB (ref 0.0–40.0)

## 2016-05-27 LAB — CBC WITH DIFFERENTIAL/PLATELET
BASOS ABS: 0 10*3/uL (ref 0.0–0.1)
BASOS PCT: 0.4 % (ref 0.0–3.0)
EOS ABS: 0.1 10*3/uL (ref 0.0–0.7)
Eosinophils Relative: 1.1 % (ref 0.0–5.0)
HEMATOCRIT: 42.2 % (ref 36.0–46.0)
HEMOGLOBIN: 14.3 g/dL (ref 12.0–15.0)
LYMPHS PCT: 22.1 % (ref 12.0–46.0)
Lymphs Abs: 2.5 10*3/uL (ref 0.7–4.0)
MCHC: 33.9 g/dL (ref 30.0–36.0)
MCV: 83.6 fl (ref 78.0–100.0)
MONO ABS: 0.8 10*3/uL (ref 0.1–1.0)
Monocytes Relative: 7.2 % (ref 3.0–12.0)
Neutro Abs: 7.8 10*3/uL — ABNORMAL HIGH (ref 1.4–7.7)
Neutrophils Relative %: 69.2 % (ref 43.0–77.0)
Platelets: 457 10*3/uL — ABNORMAL HIGH (ref 150.0–400.0)
RBC: 5.04 Mil/uL (ref 3.87–5.11)
RDW: 13.4 % (ref 11.5–15.5)
WBC: 11.3 10*3/uL — AB (ref 4.0–10.5)

## 2016-05-27 LAB — COMPREHENSIVE METABOLIC PANEL
ALBUMIN: 4.4 g/dL (ref 3.5–5.2)
ALK PHOS: 74 U/L (ref 39–117)
ALT: 27 U/L (ref 0–35)
AST: 19 U/L (ref 0–37)
BUN: 13 mg/dL (ref 6–23)
CALCIUM: 9.8 mg/dL (ref 8.4–10.5)
CHLORIDE: 104 meq/L (ref 96–112)
CO2: 25 mEq/L (ref 19–32)
Creatinine, Ser: 0.83 mg/dL (ref 0.40–1.20)
GFR: 85.44 mL/min (ref >60.00)
Glucose, Bld: 101 mg/dL — ABNORMAL HIGH (ref 70–99)
Potassium: 4.2 mEq/L (ref 3.5–5.1)
SODIUM: 139 meq/L (ref 135–145)
TOTAL PROTEIN: 7.1 g/dL (ref 6.0–8.3)
Total Bilirubin: 0.4 mg/dL (ref 0.2–1.2)

## 2016-05-27 LAB — LDL CHOLESTEROL, DIRECT: LDL DIRECT: 165 mg/dL

## 2016-05-27 LAB — TSH: TSH: 2 u[IU]/mL (ref 0.35–4.50)

## 2016-05-27 NOTE — Progress Notes (Signed)
Subjective:  Patient ID: Justus Memory, female    DOB: 12-19-1985  Age: 31 y.o. MRN: [id number]  CC: Annual Exam   HPI Yemaya Barnier presents for a CPX.  She complains that she has had spells off and on for about 15 years. She describes an intense sense of dj vu that lasts for about 10 or 15 seconds. During this time she has some word finding difficulty. Her friends noticed that she looks disconnected during the spells. Her last one was one day prior to this visit. She doesn't describe any kind of warning or aura and she doesn't have a post ictal phenomenon. 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  Outpatient Medications Prior to Visit  Medication Sig Dispense Refill  . Levonorgestrel (SKYLA) 13.5 MG IUD by Intrauterine route.    . clonazePAM (KLONOPIN) 0.5 MG tablet Take by mouth as needed for anxiety. 1/2-1 tablet as needed    . meloxicam (MOBIC) 7.5 MG tablet TAKE 1 TABLET BY MOUTH EVERY DAY 30 tablet 4  . sertraline (ZOLOFT) 25 MG tablet Take 25 mg by mouth 2 (two) times daily.     No facility-administered medications prior to visit.     ROS Review of Systems  Constitutional: Negative.  Negative for chills, fatigue and unexpected weight change.  HENT: Negative.  Negative for sore throat and trouble swallowing.   Eyes: Negative.   Respiratory: Negative.  Negative for cough, chest tightness, shortness of breath and wheezing.   Cardiovascular: Negative for chest pain, palpitations and leg swelling.  Gastrointestinal: Negative for abdominal pain, constipation, diarrhea, nausea and vomiting.  Endocrine: Negative.   Genitourinary: Negative.  Negative for difficulty urinating.  Musculoskeletal: Negative for back pain and myalgias.  Skin: Negative.  Negative for color change and rash.  Allergic/Immunologic: Negative.   Neurological: Positive for seizures. Negative for  dizziness, weakness and headaches.  Hematological: Negative for adenopathy. Does not bruise/bleed easily.  Psychiatric/Behavioral: Negative.  Negative for dysphoric mood and sleep disturbance. The patient is not nervous/anxious.   All other systems reviewed and are negative.   Objective:  BP 110/80 (BP Location: Left Arm, Patient Position: Sitting, Cuff Size: Large)   Pulse 90   Temp 98.6 F (37 C) (Oral)   Ht  (1.626 m)   Wt 257 lb 2 oz (116.6 kg)   SpO2 99%   BMI 44.14 kg/m   BP Readings from Last 3 Encounters:  05/27/16 110/80  06/15/14 106/68  02/09/14 120/84    Wt Readings from Last 3 Encounters:  05/27/16 257 lb 2 oz (116.6 kg)  06/15/14 241 lb (109.3 kg)  02/09/14 254 lb (115.2 kg)    Physical Exam  Constitutional: She is oriented to person, place, and time. No distress.  HENT:  Mouth/Throat: Oropharynx is clear and moist. No oropharyngeal exudate.  Eyes: Conjunctivae are normal. Right eye exhibits no discharge. Left eye exhibits no discharge. No scleral icterus.  Neck: Normal range of motion. Neck supple. No JVD present. No tracheal deviation present. No thyromegaly present.  Cardiovascular: Normal rate, regular rhythm, normal heart sounds and intact distal pulses.  Exam reveals no gallop and no friction rub.   No murmur heard. Pulmonary/Chest: Effort normal and breath sounds normal. No stridor. No respiratory distress. She has no wheezes. She has no rales. She exhibits no tenderness.  Abdominal: Soft. Bowel sounds are normal. She exhibits no distension and  no mass. There is no tenderness. There is no rebound and no guarding.  Musculoskeletal: Normal range of motion. She exhibits deformity. She exhibits no edema or tenderness.  Lymphadenopathy:    She has no cervical adenopathy.  Neurological: She is oriented to person, place, and time.  Skin: Skin is warm and dry. No rash noted. She is not diaphoretic. No erythema. No pallor.  Vitals reviewed.   Lab  Results  Component Value Date   WBC 11.3 (H) 05/27/2016   HGB 14.3 05/27/2016   HCT 42.2 05/27/2016   PLT 457.0 (H) 05/27/2016   GLUCOSE 101 (H) 05/27/2016   CHOL 236 (H) 05/27/2016   TRIG 288.0 (H) 05/27/2016   HDL 35.30 (L) 05/27/2016   LDLDIRECT 165.0 05/27/2016   LDLCALC 168 (H) 02/09/2014   ALT 27 05/27/2016   AST 19 05/27/2016   NA 139 05/27/2016   K 4.2 05/27/2016   CL 104 05/27/2016   CREATININE 0.83 05/27/2016   BUN 13 05/27/2016   CO2 25 05/27/2016   TSH 2.00 05/27/2016   HGBA1C 5.7 02/09/2014    No results found.  Assessment & Plan:   Selia was seen today for annual exam.  Diagnoses and all orders for this visit:  Routine general medical examination at a health care facility- exam completed, labs ordered and reviewed, her Pap is up-to-date, vaccines reviewed, patient education material was given. -     Lipid panel; Future -     Comprehensive metabolic panel; Future -     CBC with Differential/Platelet; Future -     TSH; Future  Deja vu seizure disorder (HCC) -     Ambulatory referral to Neurology  Leukocytosis, unspecified type- she has had a mild, stable elevation in her white cell count and platelet count for several years. She has no systemic illness that concerning for lymphoproliferative disease. Her other cell lines are normal and she is not anemic. I think this is a benign condition for her.   I have discontinued Ms. Brinkmeier's sertraline, clonazePAM, and meloxicam. I am also having her maintain her Levonorgestrel, PARoxetine, cetirizine, and ranitidine.  Meds ordered this encounter  Medications  . PARoxetine (PAXIL) 30 MG tablet    Sig: 30 mg.    Refill:  3  . cetirizine (ZYRTEC) 10 MG tablet    Sig: Take 10 mg by mouth daily.  . ranitidine (ZANTAC) 150 MG tablet    Sig: Take 150 mg by mouth 2 (two) times daily.     Follow-up: Return if symptoms worsen or fail to improve.  Sanda Linger, MD

## 2016-05-27 NOTE — Patient Instructions (Signed)
Preventive Care 18-39 Years, Female Preventive care refers to lifestyle choices and visits with your health care provider that can promote health and wellness. What does preventive care include?  A yearly physical exam. This is also called an annual well check.  Dental exams once or twice a year.  Routine eye exams. Ask your health care provider how often you should have your eyes checked.  Personal lifestyle choices, including:  Daily care of your teeth and gums.  Regular physical activity.  Eating a healthy diet.  Avoiding tobacco and drug use.  Limiting alcohol use.  Practicing safe sex.  Taking vitamin and mineral supplements as recommended by your health care provider. What happens during an annual well check? The services and screenings done by your health care provider during your annual well check will depend on your age, overall health, lifestyle risk factors, and family history of disease. Counseling  Your health care provider may ask you questions about your:  Alcohol use.  Tobacco use.  Drug use.  Emotional well-being.  Home and relationship well-being.  Sexual activity.  Eating habits.  Work and work environment.  Method of birth control.  Menstrual cycle.  Pregnancy history. Screening  You may have the following tests or measurements:  Height, weight, and BMI.  Diabetes screening. This is done by checking your blood sugar (glucose) after you have not eaten for a while (fasting).  Blood pressure.  Lipid and cholesterol levels. These may be checked every 5 years starting at age 20.  Skin check.  Hepatitis C blood test.  Hepatitis B blood test.  Sexually transmitted disease (STD) testing.  BRCA-related cancer screening. This may be done if you have a family history of breast, ovarian, tubal, or peritoneal cancers.  Pelvic exam and Pap test. This may be done every 3 years starting at age 21. Starting at age 30, this may be done every 5  years if you have a Pap test in combination with an HPV test. Discuss your test results, treatment options, and if necessary, the need for more tests with your health care provider. Vaccines  Your health care provider may recommend certain vaccines, such as:  Influenza vaccine. This is recommended every year.  Tetanus, diphtheria, and acellular pertussis (Tdap, Td) vaccine. You may need a Td booster every 10 years.  Varicella vaccine. You may need this if you have not been vaccinated.  HPV vaccine. If you are 26 or younger, you may need three doses over 6 months.  Measles, mumps, and rubella (MMR) vaccine. You may need at least one dose of MMR. You may also need a second dose.  Pneumococcal 13-valent conjugate (PCV13) vaccine. You may need this if you have certain conditions and were not previously vaccinated.  Pneumococcal polysaccharide (PPSV23) vaccine. You may need one or two doses if you smoke cigarettes or if you have certain conditions.  Meningococcal vaccine. One dose is recommended if you are age 19-21 years and a first-year college student living in a residence hall, or if you have one of several medical conditions. You may also need additional booster doses.  Hepatitis A vaccine. You may need this if you have certain conditions or if you travel or work in places where you may be exposed to hepatitis A.  Hepatitis B vaccine. You may need this if you have certain conditions or if you travel or work in places where you may be exposed to hepatitis B.  Haemophilus influenzae type b (Hib) vaccine. You may need this   if you have certain risk factors. Talk to your health care provider about which screenings and vaccines you need and how often you need them. This information is not intended to replace advice given to you by your health care provider. Make sure you discuss any questions you have with your health care provider. Document Released: 03/19/2001 Document Revised: 10/11/2015  Document Reviewed: 11/22/2014 Elsevier Interactive Patient Education  2017 [employer].

## 2016-05-27 NOTE — Progress Notes (Signed)
Pre visit review using our clinic review tool, if applicable. No additional management support is needed unless otherwise documented below in the visit note. 

## 2016-05-28 ENCOUNTER — Encounter: Payer: Self-pay | Admitting: Internal Medicine

## 2016-05-29 DIAGNOSIS — D72829 Elevated white blood cell count, unspecified: Secondary | ICD-10-CM | POA: Insufficient documentation

## 2016-06-04 ENCOUNTER — Encounter: Payer: Self-pay | Admitting: Neurology

## 2016-06-18 ENCOUNTER — Ambulatory Visit (INDEPENDENT_AMBULATORY_CARE_PROVIDER_SITE_OTHER): Payer: 59 | Admitting: Neurology

## 2016-06-18 ENCOUNTER — Encounter: Payer: Self-pay | Admitting: Neurology

## 2016-06-18 VITALS — BP 96/60 | HR 86 | Ht 63.5 in | Wt 248.0 lb

## 2016-06-18 DIAGNOSIS — G40109 Localization-related (focal) (partial) symptomatic epilepsy and epileptic syndromes with simple partial seizures, not intractable, without status epilepticus: Secondary | ICD-10-CM

## 2016-06-18 NOTE — Patient Instructions (Signed)
1. Schedule MRI brain with and without contrast 2. Schedule 1-hour sleep-deprived EEG 3. Schedule 48-hour EEG 4. Follow-up after tests, call for any change in symptoms

## 2016-06-18 NOTE — Progress Notes (Signed)
NEUROLOGY CONSULTATION NOTE  Rebecca Lawson Denz MRN: [id number] DOB: 06-25-1985  Referring provider: Dr. Sanda Lingerhomas Jones Primary care provider: Dr. Sanda Lingerhomas Jones  Reason for consult:  Deja vu episodes  Dear Dr Yetta BarreJones:  Thank you for your kind referral of Rebecca Lawson Capobianco for consultation of the above symptoms. Although her history is well known to you, please allow me to reiterate it for the purpose of our medical record. The patient was accompanied to the clinic by her husband who also provides collateral information. Records and images were personally reviewed where available.  HISTORY OF PRESENT ILLNESS: This is a very pleasant 31 year old right-handed woman with a history of anxiety, depression, presenting for evaluation of recurrent stereotyped episodes that started in her teenage years. 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 She reports symptoms are stereotyped, she would start having a feeling like she is recalling something from a dream, it feels familiar but she has never experienced it. Then her stomach does a "weird thing" where it feels like it is rising then flipping over. She would stop talking and struggle to keep interacting when they occur, lasting 5-20 seconds. Afterwards she feels confused and has a hard time with her short-term memory, and feels tired. When they occur at work, she has to check back on what she did and would find silly mistakes. Her husband reports she has a glazed look for a few seconds but they both state she can drive when they occur, no loss of awareness or consciousness. They seem to occur more after she would hit her head, or probably when more sleep deprived. She has woken up with one, feeling like she was dreaming then waking up and going back to sleep. No convulsive or myoclonic activity noted, no tongue bite or incontinence. She had  gone 5-6 years with no symptoms, then last February they recurred, occurring around 1-2 times a week. She had previously been taking clonazepam and stopped it several months before February.   She denies any olfactory/gustatory hallucinations, focal numbness/tingling/weakness, myoclonic jerks, no automatisms. No dizziness, headaches, diplopia, dysarthria/dysphagia, neck/back pain, bowel/bladder dysfunction. She is currently working full time. She has an IUD for birth control.  Epilepsy Risk Factors:  Her mother had epilepsy as a child that she outgrew. Otherwise she had a normal birth and early development.  There is no history of febrile convulsions, CNS infections such as meningitis/encephalitis, significant traumatic brain injury, neurosurgical procedures.  PAST MEDICAL HISTORY: Past Medical History:  Diagnosis Date  . Anxiety   . Depression     PAST SURGICAL HISTORY: No past surgical history on file.  MEDICATIONS: Current Outpatient Prescriptions on File Prior to Visit  Medication Sig Dispense Refill  . cetirizine (ZYRTEC) 10 MG tablet Take 10 mg by mouth daily.    . Levonorgestrel (SKYLA) 13.5 MG IUD by Intrauterine route.    Marland Kitchen. PARoxetine (PAXIL) 30 MG tablet 30 mg.  3  . ranitidine (ZANTAC) 150 MG tablet Take 150 mg by mouth 2 (two) times daily.     No current facility-administered medications on file prior to visit.     ALLERGIES: No Known Allergies  FAMILY HISTORY: Family History  Problem Relation Age of Onset  . Arthritis Other   . Depression Other   . Hypertension Other   . Anxiety disorder Other   . Heart disease Father   .  Cancer Maternal Grandfather   . Birth defects Paternal Grandfather   . Heart disease Paternal Grandfather     SOCIAL HISTORY: Social History   Social History  . Marital status: Single    Spouse name: N/A  . Number of children: N/A  . Years of education: N/A   Occupational History  . Not on file.   Social History Main Topics  .  Smoking status: Former Games developer  . Smokeless tobacco: Never Used     Comment: Regular Exercise - Yes  . Alcohol use 1.8 oz/week    3 Glasses of wine per week  . Drug use: No  . Sexual activity: Not Currently    Birth control/ protection: IUD, Condom   Other Topics Concern  . Not on file   Social History Narrative  . No narrative on file    REVIEW OF SYSTEMS: Constitutional: No fevers, chills, or sweats, no generalized fatigue, change in appetite Eyes: No visual changes, double vision, eye pain Ear, nose and throat: No hearing loss, ear pain, nasal congestion, sore throat Cardiovascular: No chest pain, palpitations Respiratory:  No shortness of breath at rest or with exertion, wheezes GastrointestinaI: No nausea, vomiting, diarrhea, abdominal pain, fecal incontinence Genitourinary:  No dysuria, urinary retention or frequency Musculoskeletal:  No neck pain, back pain Integumentary: No rash, pruritus, skin lesions Neurological: as above Psychiatric: No depression, insomnia, anxiety currently Endocrine: No palpitations, fatigue, diaphoresis, mood swings, change in appetite, change in weight, increased thirst Hematologic/Lymphatic:  No anemia, purpura, petechiae. Allergic/Immunologic: no itchy/runny eyes, nasal congestion, recent allergic reactions, rashes  PHYSICAL EXAM: Vitals:   06/18/16 1257  BP: 96/60  Pulse: 86   General: No acute distress Head:  Normocephalic/atraumatic Eyes: Fundoscopic exam shows bilateral sharp discs, no vessel changes, exudates, or hemorrhages Neck: supple, no paraspinal tenderness, full range of motion Back: No paraspinal tenderness Heart: regular rate and rhythm Lungs: Clear to auscultation bilaterally. Vascular: No carotid bruits. Skin/Extremities: No rash, no edema Neurological Exam: Mental status: alert and oriented to person, place, and time, no dysarthria or aphasia, Fund of knowledge is appropriate.  Recent and remote memory are intact.   Attention and concentration are normal.    Able to name objects and repeat phrases. Cranial nerves: CN I: not tested CN II: pupils equal, round and reactive to light, visual fields intact, fundi unremarkable. CN III, IV, VI:  full range of motion, no nystagmus, no ptosis CN V: facial sensation intact CN VII: upper and lower face symmetric CN VIII: hearing intact to finger rub CN IX, X: gag intact, uvula midline CN XI: sternocleidomastoid and trapezius muscles intact CN XII: tongue midline Bulk & Tone: normal, no fasciculations. Motor: 5/5 throughout with no pronator drift. Sensation: intact to light touch, cold, pin, vibration and joint position sense.  No extinction to double simultaneous stimulation.  Romberg test negative Deep Tendon Reflexes: brisk +2 throughout (?slightly more brisk on right UE), no ankle clonus Plantar responses: downgoing bilaterally Cerebellar: no incoordination on finger to nose, heel to shin. No dysdiadochokinesia Gait: narrow-based and steady, able to tandem walk adequately. 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 Anxiety can also cause deja vu sensations, however she denies feeling anxious when they occur. MRI brain with and without contrast and a 1-hour sleep-deprived  EEG will be ordered to assess for focal abnormalities that increase risk for recurrent seizures. If EEG is normal, a 48-hour EEG will be ordered to further classify her symptoms. They have no pregnancy plans at this time.  [place] driving laws were discussed with the patient, and she knows to stop driving after an episode of loss of awareness, until 6 months event-free. She will follow-up after the tests and know to call for any changes.    Thank you for allowing me to participate in the care of this patient. Please do not hesitate to call for any questions or concerns.   Patrcia Dolly, M.D.  CC: Dr. Yetta Barre

## 2016-06-20 ENCOUNTER — Telehealth: Payer: Self-pay

## 2016-06-20 ENCOUNTER — Ambulatory Visit (INDEPENDENT_AMBULATORY_CARE_PROVIDER_SITE_OTHER): Payer: 59 | Admitting: Neurology

## 2016-06-20 DIAGNOSIS — G40109 Localization-related (focal) (partial) symptomatic epilepsy and epileptic syndromes with simple partial seizures, not intractable, without status epilepticus: Secondary | ICD-10-CM

## 2016-06-20 NOTE — Telephone Encounter (Signed)
-----   Message from Van ClinesKaren M Aquino, MD sent at 06/20/2016  1:12 PM EDT ----- Pls let her know EEG is normal, proceed with 48-hour EEG as discussed. Thanks!

## 2016-06-20 NOTE — Procedures (Signed)
ELECTROENCEPHALOGRAM REPORT  Date of Study: 06/20/2016  Patient's Name: Rebecca Lawson MRN: [id number] Date of Birth: May 01, 1985  Referring Provider: Dr. Patrcia DollyKaren Shamona Wirtz  Clinical History: This is a 31 year old woman with a long-standing history of recurrent stereotyped episodes of deja vu with epigastric sensation. EEG for classification.  Medications: Zyrtec, Skyla, Paxil, Zantac  Technical Summary: A multichannel digital 1-hour sleep-deprived EEG recording measured by the international 10-20 system with electrodes applied with paste and impedances below 5000 ohms performed in our laboratory with EKG monitoring in an awake and asleep patient.  Hyperventilation and photic stimulation were performed.  The digital EEG was referentially recorded, reformatted, and digitally filtered in a variety of bipolar and referential montages for optimal display.    Description: The patient is awake and asleep during the recording.  During maximal wakefulness, there is a symmetric, medium voltage 11-11.5 Hz posterior dominant rhythm that attenuates with eye opening.  The record is symmetric.  During drowsiness and sleep, there is an increase in theta slowing of the background, with shifting asymmetry over the bilateral temporal regions, left greater than right.  Vertex waves and symmetric sleep spindles were seen.  Hyperventilation and photic stimulation did not elicit any abnormalities.  There were no epileptiform discharges or electrographic seizures seen.    EKG lead was unremarkable.  Impression: This 1-hour awake and asleep EEG is within normal limits.  Clinical Correlation: A normal EEG does not exclude a clinical diagnosis of epilepsy.  If further clinical questions remain, prolonged EEG may be helpful.  Clinical correlation is advised.   Patrcia DollyKaren Celestina Gironda, M.D.

## 2016-06-20 NOTE — Telephone Encounter (Signed)
LMOM on husbands voicemail giving the EEG results and also letting him/them know that they will be getting a call from [facility] to schedule the 48 hour EEG

## 2016-06-20 NOTE — Telephone Encounter (Signed)
-----   Message from Karen M Aquino, MD sent at 06/20/2016  1:12 PM EDT ----- Pls let her know EEG is normal, proceed with 48-hour EEG as discussed. Thanks! 

## 2016-06-24 ENCOUNTER — Telehealth: Payer: Self-pay | Admitting: Neurology

## 2016-06-24 NOTE — Telephone Encounter (Signed)
Patient states that is returning someone call

## 2016-06-24 NOTE — Telephone Encounter (Signed)
Returned pt call.  Relayed message I had left on her husbands VM about EEG results and moving forward with 48 hour EEG.  Pt was concerned that [facility] has not [facility] in regards to her [facility] order.  I let her know that they work from a work queue and should be getting a call from them some times this week to schedule.

## 2016-06-26 ENCOUNTER — Telehealth: Payer: Self-pay

## 2016-06-26 DIAGNOSIS — Z021 Encounter for pre-employment examination: Secondary | ICD-10-CM

## 2016-06-27 NOTE — Telephone Encounter (Signed)
Printed pt immunization record.  Pt does not have 2 mmr or any varicella vaccine in her record.   Titers entered.  Pt is aware.   Pt is having OBGYN to send HPV vaccine record to [place] to add to her chart.

## 2016-07-02 ENCOUNTER — Ambulatory Visit (INDEPENDENT_AMBULATORY_CARE_PROVIDER_SITE_OTHER): Payer: 59 | Admitting: Neurology

## 2016-07-02 DIAGNOSIS — G40109 Localization-related (focal) (partial) symptomatic epilepsy and epileptic syndromes with simple partial seizures, not intractable, without status epilepticus: Secondary | ICD-10-CM

## 2016-07-03 DIAGNOSIS — G40109 Localization-related (focal) (partial) symptomatic epilepsy and epileptic syndromes with simple partial seizures, not intractable, without status epilepticus: Secondary | ICD-10-CM | POA: Diagnosis not present

## 2016-07-04 ENCOUNTER — Ambulatory Visit
Admission: RE | Admit: 2016-07-04 | Discharge: 2016-07-04 | Disposition: A | Discharge status: Home / Self Care | Payer: 59 | Source: Ambulatory Visit | Attending: Neurology | Admitting: Neurology

## 2016-07-04 ENCOUNTER — Other Ambulatory Visit: Payer: 59

## 2016-07-04 DIAGNOSIS — G40109 Localization-related (focal) (partial) symptomatic epilepsy and epileptic syndromes with simple partial seizures, not intractable, without status epilepticus: Secondary | ICD-10-CM

## 2016-07-04 MED ORDER — GADOBENATE DIMEGLUMINE 529 MG/ML IV SOLN
20.0000 mL | Freq: Once | INTRAVENOUS | Status: AC | PRN
  Administered 2016-07-04: 20 mL via INTRAVENOUS

## 2016-07-05 ENCOUNTER — Telehealth: Payer: Self-pay

## 2016-07-05 NOTE — Telephone Encounter (Signed)
Called pt.  No answer.  Voicemail is not set up.  Was unable to leave message

## 2016-07-05 NOTE — Telephone Encounter (Signed)
-----   Message from Rebecca Chardonika K Patel, DO sent at 07/05/2016  8:07 AM EDT ----- Please inform patient MRI brain is normal. Thanks.

## 2016-07-09 NOTE — Telephone Encounter (Signed)
Pt has not been to the lab to have titers drawn.

## 2016-07-12 ENCOUNTER — Other Ambulatory Visit: Payer: 59

## 2016-07-12 DIAGNOSIS — Z021 Encounter for pre-employment examination: Secondary | ICD-10-CM

## 2016-07-15 LAB — MEASLES/MUMPS/RUBELLA IMMUNITY
MUMPS IGG: 80.8 [AU]/ml — AB (ref <9.00)
Rubella: 8.63 Index — ABNORMAL HIGH (ref <0.90)
Rubeola IgG: 138 AU/mL — ABNORMAL HIGH (ref <25.00)

## 2016-07-15 LAB — VARICELLA ZOSTER ANTIBODY, IGG: Varicella IgG: 2095 Index — ABNORMAL HIGH (ref <135.00)

## 2016-07-16 ENCOUNTER — Encounter: Payer: Self-pay | Admitting: Internal Medicine

## 2016-07-16 ENCOUNTER — Encounter: Payer: Self-pay | Admitting: Neurology

## 2016-07-23 ENCOUNTER — Encounter: Payer: Self-pay | Admitting: Internal Medicine

## 2016-07-24 NOTE — Procedures (Signed)
ELECTROENCEPHALOGRAM REPORT  Dates of Recording: 07/02/2016 12:41PM to 07/04/2016 11:32AM  Patient's Name: Rebecca Lawson MRN: [id number] Date of Birth: 01-Apr-1985  Referring Provider: Dr. 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 EEG for classification  Medications: Zyrtec, Skyla, Paxil, Zantac  Technical Summary: This is a 48-hour multichannel digital EEG recording measured by the international 10-20 system with electrodes applied with paste and impedances below 5000 ohms performed as portable with EKG monitoring.  The digital EEG was referentially recorded, reformatted, and digitally filtered in a variety of bipolar and referential montages for optimal display.    DESCRIPTION OF RECORDING: During maximal wakefulness, the background activity consisted of a symmetric 11.5 Hz posterior dominant rhythm which was reactive to eye opening.  There were no epileptiform discharges or focal slowing seen in wakefulness.  During the recording, the patient progresses through wakefulness, drowsiness, and Stage 2 sleep. During drowsiness and sleep, there is an increase in theta slowing of the background, with slight asymmetry over the bilateral temporal regions, left greater than right.  Again, there were no epileptiform discharges seen.  Events: On 05/29 at 1553 hours, she reports a throbbing pain in the upper right head. Electrographically, there were no EEG or EKG changes seen.  On 05/29 at 1600 hours, she has shooting pain in the right middle finger. Electrographically, there were no EEG or EKG changes seen.  On 05/29 at 1655 hours, she has slight shooting pain in the left temple lasting 10 seconds. Electrographically, there were no EEG or EKG changes seen.  On 05/30 at 1357 and 1504 hours, she has diarrhea and slight cramps. Electrographically, there  were no EEG or EKG changes seen.  There were no electrographic seizures seen.  EKG lead was unremarkable.  IMPRESSION: This 48-hour ambulatory EEG study is within normal limits.   CLINICAL CORRELATION: A normal EEG does not exclude a clinical diagnosis of epilepsy. Typical events were not captured. Episodes of headache and finger pain, diarrhea, light cramps, did not show electrographic correlate.  If further clinical questions remain, inpatient video EEG monitoring may be helpful.   Patrcia DollyKaren Dariya Gainer, M.D.

## 2016-07-26 ENCOUNTER — Ambulatory Visit (INDEPENDENT_AMBULATORY_CARE_PROVIDER_SITE_OTHER): Payer: 59 | Admitting: Neurology

## 2016-07-26 ENCOUNTER — Encounter: Payer: Self-pay | Admitting: Neurology

## 2016-07-26 VITALS — BP 106/72 | HR 73 | Ht 63.5 in | Wt 250.0 lb

## 2016-07-26 DIAGNOSIS — G40109 Localization-related (focal) (partial) symptomatic epilepsy and epileptic syndromes with simple partial seizures, not intractable, without status epilepticus: Secondary | ICD-10-CM

## 2016-07-26 MED ORDER — LAMOTRIGINE ER 25 MG PO TB24: ORAL_TABLET | ORAL | 2 refills | Status: AC

## 2016-07-26 NOTE — Progress Notes (Signed)
NEUROLOGY FOLLOW UP OFFICE NOTE  Rebecca Lawson [id number] 1985/10/15  HISTORY OF PRESENT ILLNESS: I had the pleasure of seeing Rebecca Lawson in follow-up in the neurology clinic on 07/26/2016.  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 I personally reviewed MRI brain with and without contrast which was normal, hippocampi symmetric with no abnormal signal or enhancement seen. Her 1-hour and prolonged ambulatory 48-hour EEG were within normal limits, there was some asymmetry over the bilateral temporal regions, left greater than right, seen during drowsiness and sleep, no clear epileptiform abnormalities. She did not have any typical events during the ambulatory EEG. She reports 4 typical events in the past month, on 5/22, 5/27, 6/8, and 6/16, which were all brief. She denies any headaches, dizziness, diplopia, focal numbness/tingling/weakness, no falls.   06/18/2016: This is a very pleasant 31 yo RH woman with a history of anxiety, depression, with recurrent stereotyped episodes that started in her teenage years. 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 She reports symptoms are stereotyped, she would start having a feeling like she is recalling something from a dream, it feels familiar but she has never experienced it. Then her stomach does a "weird thing" where it feels like it is rising then flipping over. She would stop talking and struggle to keep interacting when they occur, lasting 5-20 seconds. Afterwards she feels confused and has a hard time with her short-term memory, and feels tired. When they occur at work, she has to check back on what she did and would find silly mistakes. Her husband reports she has a glazed look for a few seconds but they both state  she can drive when they occur, no loss of awareness or consciousness. They seem to occur more after she would hit her head, or probably when more sleep deprived. She has woken up with one, feeling like she was dreaming then waking up and going back to sleep. No convulsive or myoclonic activity noted, no tongue bite or incontinence. She had gone 5-6 years with no symptoms, then last February they recurred, occurring around 1-2 times a week. She had previously been taking clonazepam and stopped it several months before February.   She denies any olfactory/gustatory hallucinations, focal numbness/tingling/weakness, myoclonic jerks, no automatisms. No dizziness, headaches, diplopia, dysarthria/dysphagia, neck/back pain, bowel/bladder dysfunction. She is currently working full time. She has an IUD for birth control.  Epilepsy Risk Factors:  Her mother had epilepsy as a child that she outgrew. Otherwise she had a normal birth and early development.  There is no history of febrile convulsions, CNS infections such as meningitis/encephalitis, significant traumatic brain injury, neurosurgical procedures  PAST MEDICAL HISTORY: Past Medical History:  Diagnosis Date  . Anxiety   . Depression     MEDICATIONS: Current Outpatient Prescriptions on File Prior to Visit  Medication Sig Dispense Refill  . cetirizine (ZYRTEC) 10 MG tablet Take 10 mg by mouth daily.    . Levonorgestrel (SKYLA) 13.5 MG IUD by Intrauterine route.    Marland Kitchen omeprazole (PRILOSEC) 20 MG capsule Take 20 mg by mouth daily.    Marland Kitchen PARoxetine (PAXIL) 30 MG tablet 30 mg.  3  . ranitidine (ZANTAC) 150 MG tablet Take 150 mg by mouth 2 (two) times daily.  No current facility-administered medications on file prior to visit.     ALLERGIES: No Known Allergies  FAMILY HISTORY: Family History  Problem Relation Age of Onset  . Heart disease Father   . Cancer Maternal Grandfather   . Birth defects Paternal Grandfather   . Heart disease  Paternal Grandfather   . Arthritis Other   . Depression Other   . Hypertension Other   . Anxiety disorder Other     SOCIAL HISTORY: Social History   Social History  . Marital status: Single    Spouse name: N/A  . Number of children: N/A  . Years of education: N/A   Occupational History  . Not on file.   Social History Main Topics  . Smoking status: Former Games developermoker  . Smokeless tobacco: Never Used     Comment: Regular Exercise - Yes  . Alcohol use 1.8 oz/week    3 Glasses of wine per week  . Drug use: No  . Sexual activity: Not Currently    Birth control/ protection: IUD, Condom   Other Topics Concern  . Not on file   Social History Narrative  . No narrative on file    REVIEW OF SYSTEMS: Constitutional: No fevers, chills, or sweats, no generalized fatigue, change in appetite Eyes: No visual changes, double vision, eye pain Ear, nose and throat: No hearing loss, ear pain, nasal congestion, sore throat Cardiovascular: No chest pain, palpitations Respiratory:  No shortness of breath at rest or with exertion, wheezes GastrointestinaI: No nausea, vomiting, diarrhea, abdominal pain, fecal incontinence Genitourinary:  No dysuria, urinary retention or frequency Musculoskeletal:  No neck pain, back pain Integumentary: No rash, pruritus, skin lesions Neurological: as above Psychiatric: No depression, insomnia, anxiety Endocrine: No palpitations, fatigue, diaphoresis, mood swings, change in appetite, change in weight, increased thirst Hematologic/Lymphatic:  No anemia, purpura, petechiae. Allergic/Immunologic: no itchy/runny eyes, nasal congestion, recent allergic reactions, rashes  PHYSICAL EXAM: Vitals:   07/26/16 1455  BP: 106/72  Pulse: 73   General: No acute distress Head:  Normocephalic/atraumatic Neck: supple, no paraspinal tenderness, full range of motion Heart:  Regular rate and rhythm Lungs:  Clear to auscultation bilaterally Back: No paraspinal  tenderness Skin/Extremities: No rash, no edema Neurological Exam: alert and oriented to person, place, and time. No aphasia or dysarthria. Fund of knowledge is appropriate.  Recent and remote memory are intact.  Attention and concentration are normal.    Able to name objects and repeat phrases. Cranial nerves: Pupils equal, round, reactive to light.  Extraocular movements intact with no nystagmus. Visual fields full. Facial sensation intact. No facial asymmetry. Tongue, uvula, palate midline.  Motor: Bulk and tone normal, muscle strength 5/5 throughout with no pronator drift.  Sensation to light touch intact.  No extinction to double simultaneous stimulation.  Deep tendon reflexes 2+ throughout, toes downgoing.  Finger to nose testing intact.  Gait narrow-based and steady, able to tandem walk adequately.  Romberg negative.  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 Her MRI brain was normal. Prolonged EEG did not show any epileptiform abnormality, there was slight asymmetry over the bilateral temporal regions, left greater than right, that was still within normal limits. Typical events were not captured. We discussed options, including close clinical monitoring without  any medication versus therapeutic trial with Lamotrigine. She would like to think about it. We discussed side effects of Lamotrigine, including Levonne Spiller syndrome. They have no pregnancy plans, she has an IUD. 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 She is aware of [place]  driving laws to stop driving after an episode of loss of awareness, until 6 months event-free. She will follow-up on a prn basis and know to call for any changes  Thank you for allowing me to participate in her care.  Please do not hesitate to call for any questions or concerns.  The duration of this appointment visit was 25 minutes of face-to-face time with the patient.  Greater than 50% of this time was spent in counseling, explanation of diagnosis, planning of further management, and coordination of care.   Patrcia Dolly, M.D.   CC: Dr. Yetta Barre

## 2016-07-26 NOTE — Patient Instructions (Signed)
1. Start Lamotrigine ER 25mg : take 1 tablet daily for 2 weeks, then increase to 2 tablets daily for 2 weeks, then increase to 4 tablets daily 2. Call our office in 6 weeks, and if doing well, we will plan to switch over to the 100mg  tablet 3. Establish care with a neurologist in [place] once you have settled down with the move 4. Follow-up on as needed basis, call for any changes  Seizure Precautions: 1. If medication has been prescribed for you to prevent seizures, take it exactly as directed.  Do not stop taking the medicine without talking to your doctor first, even if you have not had a seizure in a long time.   2. Avoid activities in which a seizure would cause danger to yourself or to others.  Don't operate dangerous machinery, swim alone, or climb in high or dangerous places, such as on ladders, roofs, or girders.  Do not drive unless your doctor says you may.  3. If you have any warning that you may have a seizure, lay down in a safe place where you can't hurt yourself.    4.  No driving for 6 months from last seizure, [place].   Please refer to the following link on the [employer]ile[employer] of [employer]'s website for more information: http://www.epilepsyfoundation.org/answerplace/Social/driving/drivingu.cfm   5.  Maintain good sleep hygiene. Avoid alcohol.  6.  Notify your neurology if you are planning pregnancy or if you become pregnant.  7.  Contact your doctor if you have any problems that may be related to the medicine you are taking.  8.  Call 911 and bring the patient back to the ED if:        A.  The seizure lasts longer than 5 minutes.       B.  The patient doesn't awaken shortly after the seizure  C.  The patient has new problems such as difficulty seeing, speaking or moving  D.  The patient was injured during the seizure  E.  The patient has a temperature over 102 F (39C)  F.  The patient vomited and now is having trouble breathing

## 2016-07-29 NOTE — Telephone Encounter (Signed)
Labs completed and forms faxed to patient as requested.

## 2016-08-02 ENCOUNTER — Ambulatory Visit: Payer: 59 | Admitting: Neurology

## 2019-01-06 IMAGING — MR MR HEAD WO/W CM
12 series · 48 of 48 positions shown · IV contrast (multihance)
Comparison: None.

CLINICAL DATA: Simple partial seizures

EXAM:
MRI HEAD WITHOUT AND WITH CONTRAST
TECHNIQUE: Multiplanar, multiecho pulse sequences of the brain and surrounding
structures were obtained without and with intravenous contrast.
CONTRAST:  20mL MULTIHANCE GADOBENATE DIMEGLUMINE 529 MG/ML IV SOLN

[Series 5: T1 · sagittal · 4.0mm · 0.75mm/px · 1 of 29 slices shown (1 of 3)]
[im 1/29]
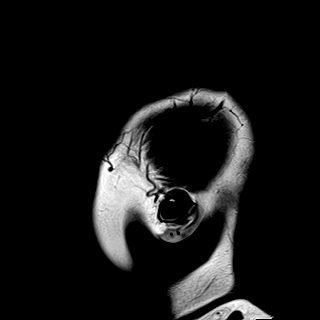

[Series 6: DWI · axial · 3.0mm · 1.44mm/px · z∈[-52,+81]mm · 6 of 84 slices shown (1 of 2)]
[im 1/84]
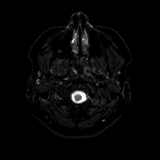
[im 17/84]
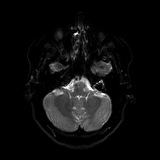
[im 34/84]
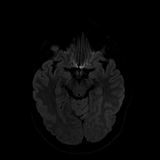
[im 50/84]
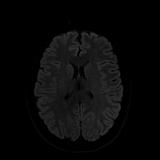
[im 67/84]
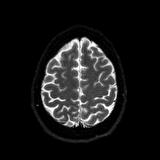
[im 84/84]
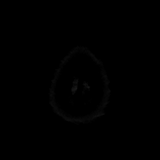

[Series 7: DWI · axial · 3.0mm · 1.44mm/px · z∈[-52,+81]mm · 3 of 42 slices shown (2 of 2)]
[im 1/42]
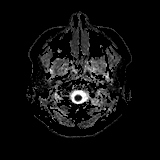
[im 21/42]
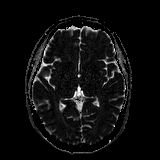
[im 42/42]
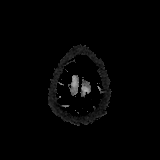

[Series 8: T2 · axial · 4.0mm · 0.36mm/px · z∈[-54,+79]mm · 2 of 27 slices shown (1 of 2)]
[im 1/27]
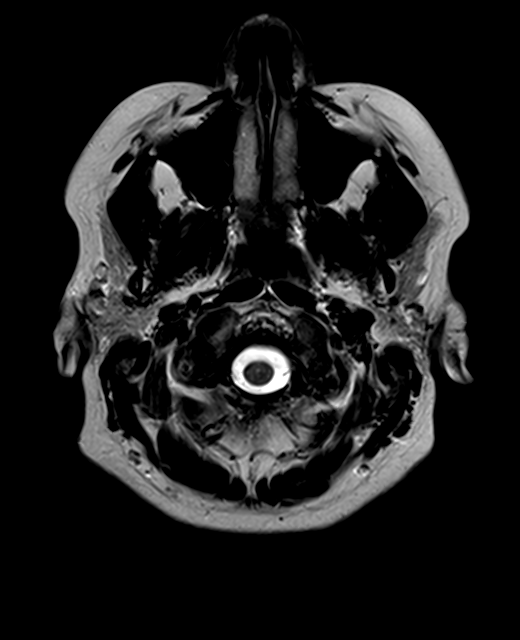
[im 27/27]
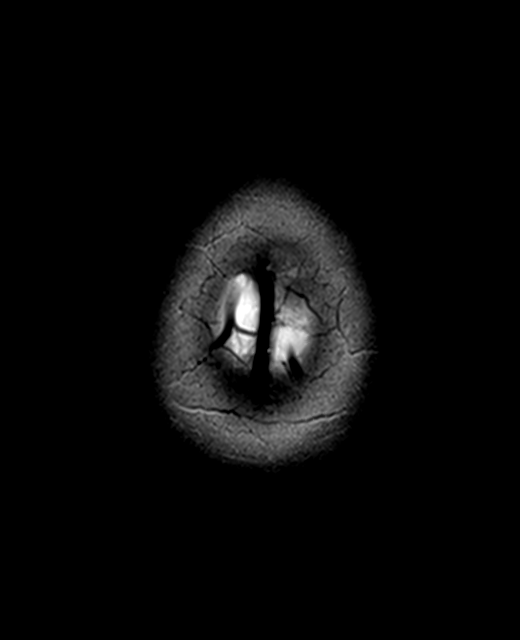

[Series 9: T2 · coronal · 3.0mm · 0.47mm/px · 2 of 25 slices shown (2 of 2)]
[im 1/25]
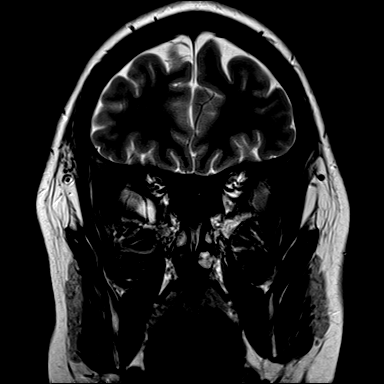
[im 25/25]
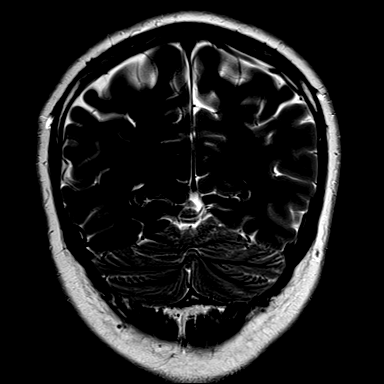

[Series 10: FLAIR · axial · 3.0mm · 0.72mm/px · z∈[-63,+85]mm · 2 of 26 slices shown]
[im 1/26]
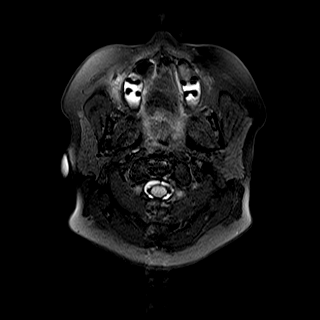
[im 26/26]
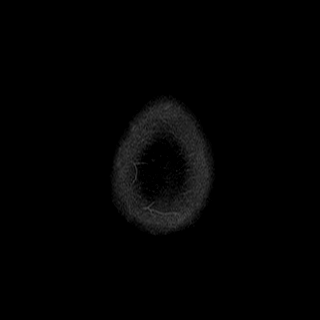

[Series 14: swi_images · axial · 1.5mm · 0.90mm/px · z∈[-57,+83]mm · 6 of 96 slices shown]
[im 1/96]
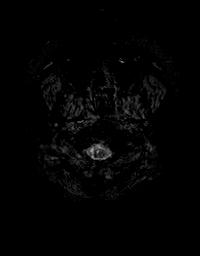
[im 20/96]
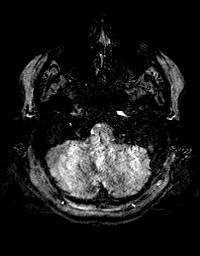
[im 39/96]
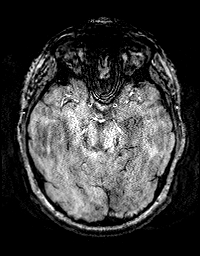
[im 58/96]
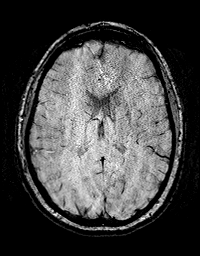
[im 77/96]
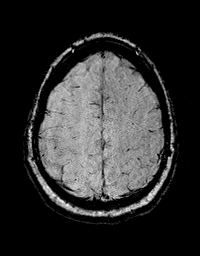
[im 96/96]
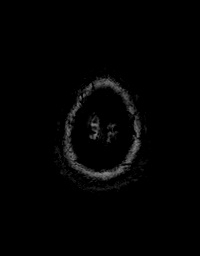

[Series 15: T1 · axial · 1.0mm · 0.90mm/px · z∈[-57,+84]mm · 10 of 144 slices shown (2 of 3)]
[im 1/144]
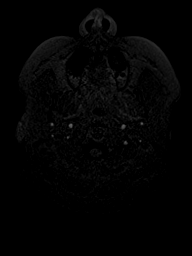
[im 16/144]
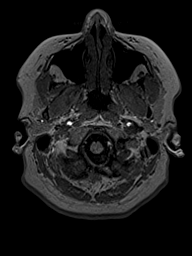
[im 32/144]
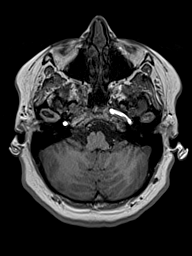
[im 48/144]
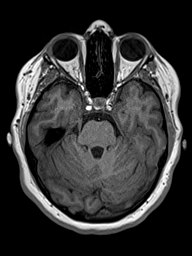
[im 64/144]
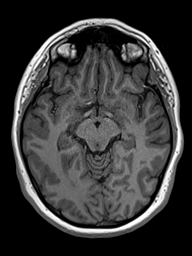
[im 80/144]
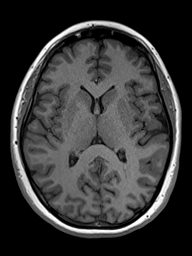
[im 96/144]
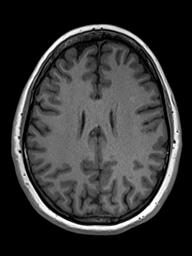
[im 112/144]
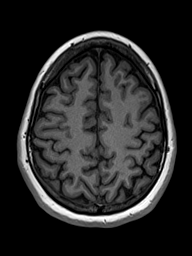
[im 128/144]
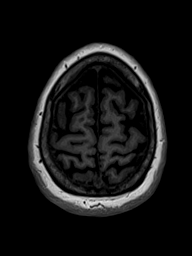
[im 144/144]
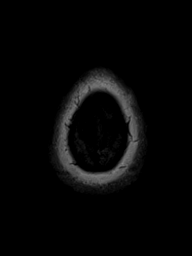

[Series 16: T2 post-contrast · coronal · 4.0mm · 0.36mm/px · 2 of 34 slices shown]
[im 1/34]
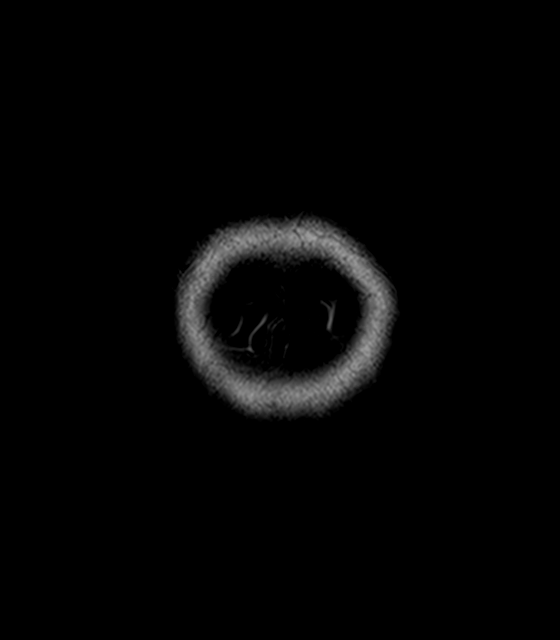
[im 34/34]
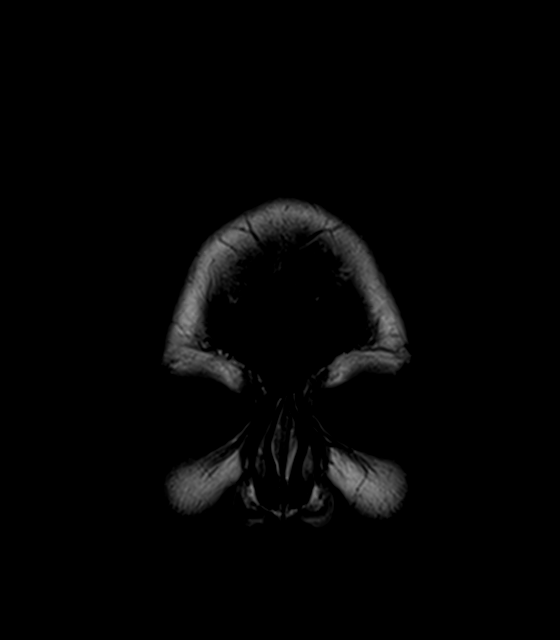

[Series 17: T1 · axial · 1.0mm · 0.90mm/px · z∈[-57,+84]mm · 10 of 144 slices shown (3 of 3)]
[im 1/144]
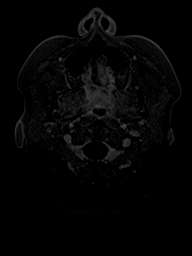
[im 16/144]
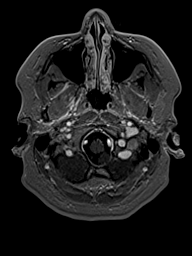
[im 32/144]
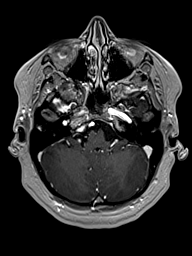
[im 48/144]
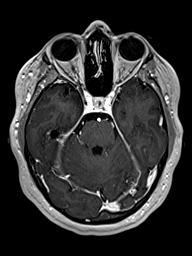
[im 64/144]
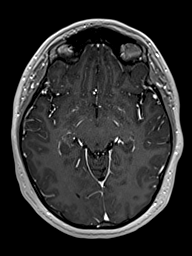
[im 80/144]
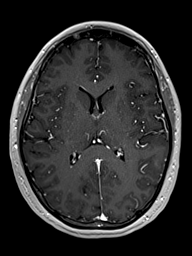
[im 96/144]
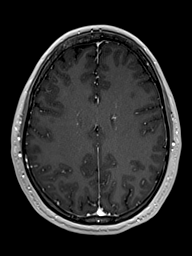
[im 112/144]
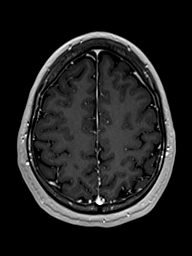
[im 128/144]
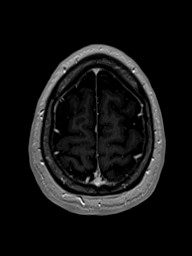
[im 144/144]
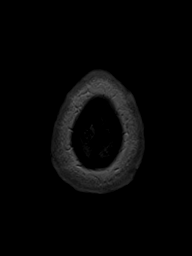

[Series 18: T1 post-contrast · coronal · 4.0mm · 0.72mm/px · 2 of 34 slices shown (1 of 2)]
[im 1/34]
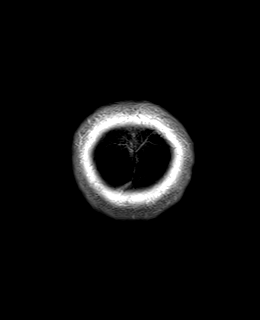
[im 34/34]
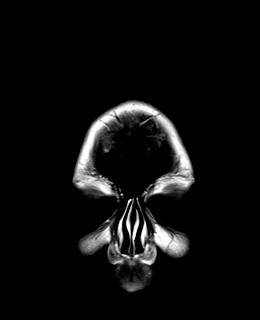

[Series 19: T1 post-contrast · sagittal · 4.0mm · 0.75mm/px · 2 of 31 slices shown (2 of 2)]
[im 1/31]
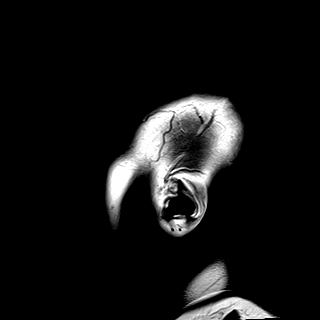
[im 31/31]
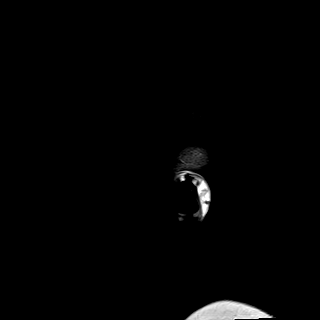

[48 of 48 positions shown; findings below may reference images not displayed]

FINDINGS: Brain: The midline structures are normal. There is no focal
diffusion restriction to indicate acute infarct. The brain
parenchymal signal is normal and there is no mass lesion. No
intraparenchymal hematoma or chronic microhemorrhage. Brain volume
is normal for age without age-advanced or lobar predominant atrophy.
The dura is normal and there is no extra-axial collection.

Vascular: Major intracranial arterial and venous sinus flow voids
are preserved. Left parietal developmental venous anomaly.

Skull and upper cervical spine: The visualized skull base,
calvarium, upper cervical spine and extracranial soft tissues are
normal.

Sinuses/Orbits: No fluid levels or advanced mucosal thickening. No
mastoid effusion. Normal orbits.
IMPRESSION: Normal brain MRI.  No etiology for seizures identified.

## 2019-05-13 ENCOUNTER — Telehealth: Payer: Self-pay | Admitting: Neurology

## 2019-05-13 NOTE — Telephone Encounter (Signed)
Please see note.

## 2019-05-13 NOTE — Telephone Encounter (Signed)
Patient's spouse called requesting to see if Dr. Karel Jarvis is comfortable doing a telehealth visit with the patient. She now lives in [place]. Her spouse said she has a new neurologist there that wants to switch her medication. However, the patient is not certain about the change and wants to talk with Dr. Karel Jarvis.  Please call patient.  Patient last seen 07/26/2016.

## 2019-05-14 NOTE — Telephone Encounter (Signed)
Patient scheduled on 08/05/19 at 9:00 AM for a VV. Patient expressed appreciation.

## 2019-05-14 NOTE — Telephone Encounter (Signed)
If she is okay with doing a virtual visit on 7/1 at 9am, that is fine with me. Can put on cancellation list. Thanks

## 2019-07-21 ENCOUNTER — Encounter: Payer: Self-pay | Admitting: Neurology

## 2019-08-05 ENCOUNTER — Telehealth: Payer: 59 | Admitting: Neurology
